# Patient Record
Sex: Female | Born: 1985 | Hispanic: Yes | Marital: Single | Smoking: Never smoker
Health system: Southern US, Community
[De-identification: ages and names within clinical notes are randomized; demographics above are authoritative.]

## PROBLEM LIST (undated history)

## (undated) DIAGNOSIS — O4440 Low lying placenta NOS or without hemorrhage, unspecified trimester: Secondary | ICD-10-CM

## (undated) HISTORY — DX: Low lying placenta nos or without hemorrhage, unspecified trimester: O44.40

---

## 2010-09-20 ENCOUNTER — Ambulatory Visit: Payer: Self-pay | Admitting: Advanced Practice Midwife

## 2010-09-23 ENCOUNTER — Ambulatory Visit: Payer: Self-pay | Admitting: Obstetrics and Gynecology

## 2010-09-25 LAB — PATHOLOGY REPORT

## 2011-01-10 ENCOUNTER — Emergency Department: Payer: Self-pay | Admitting: Emergency Medicine

## 2011-07-18 ENCOUNTER — Inpatient Hospital Stay: Payer: Self-pay | Admitting: Obstetrics and Gynecology

## 2011-08-17 IMAGING — US US OB < 14 WEEKS
1 series · 17 of 28 positions shown · non-contrast
Comparison: None

REASON FOR EXAM: lower abd pain
COMMENTS:

PROCEDURE:     US  - US OB LESS THAN 14 WEEKS  - January 10, 2011 [DATE]
RESULT:     Indication: Lower abdominal pain LMP 10/23/2010
TECHNIQUE: Multiple transabdominal gray-scale images of the pelvis
performed.

[Series 1: us ob < 14 weeks · 17 of 36 slices shown]
[im 1/36]
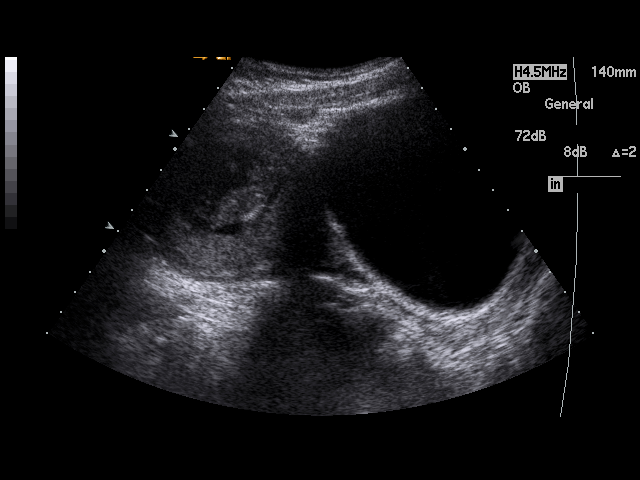
[im 3/36]
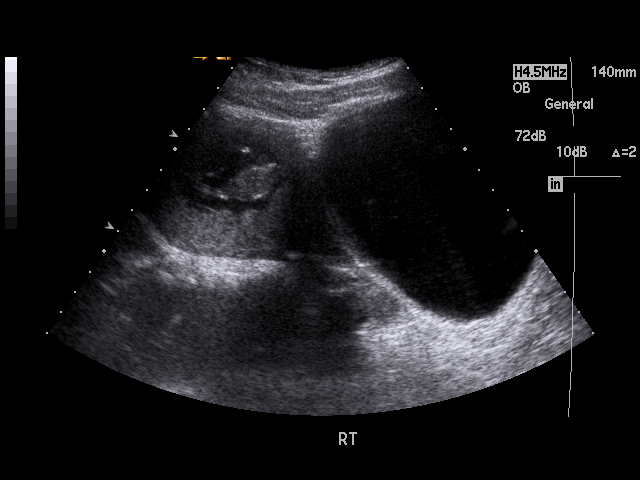
[im 6/36]
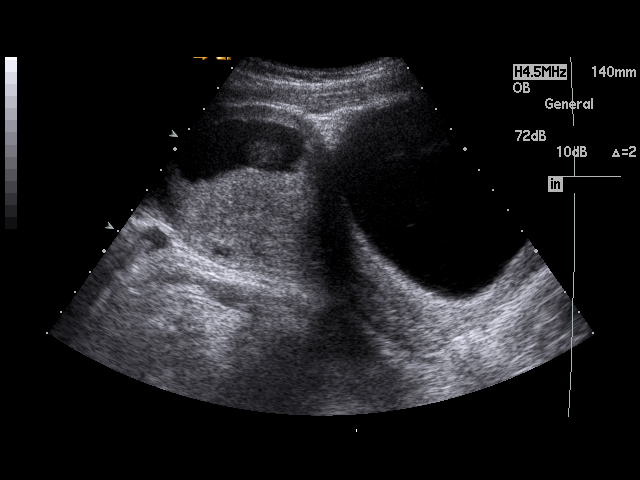
[im 7/36]
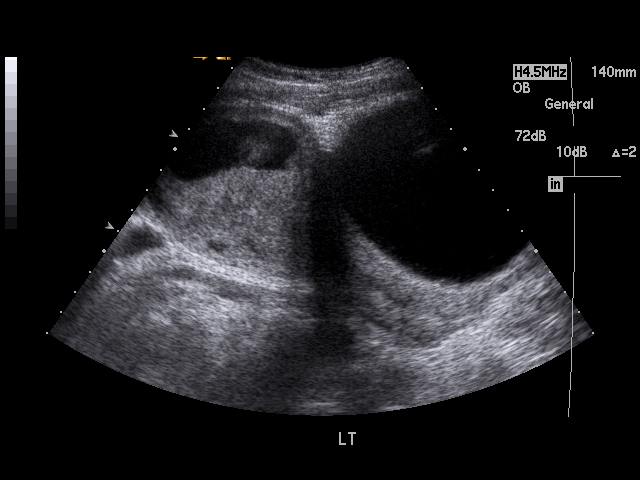
[im 10/36]
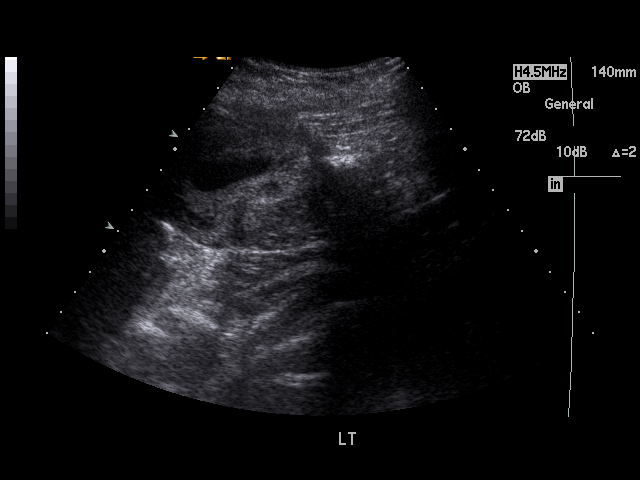
[im 12/36]
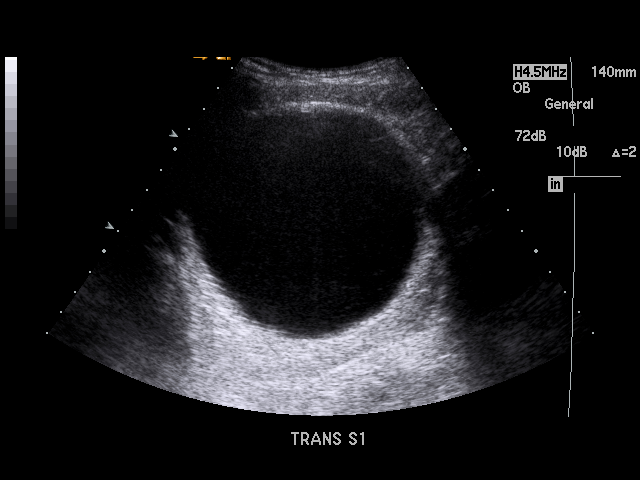
[im 13/36]
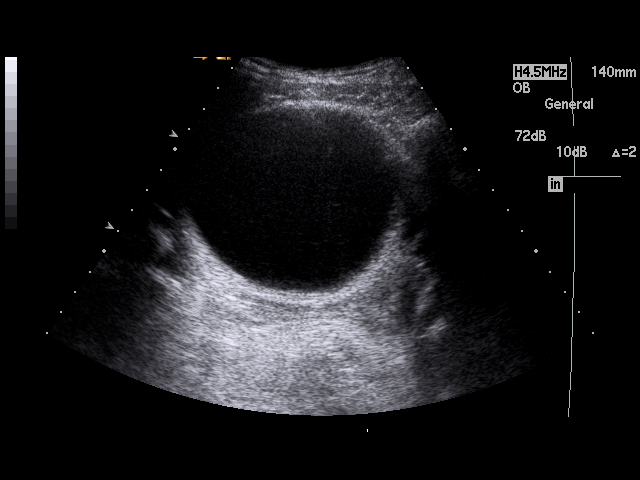
[im 16/36]
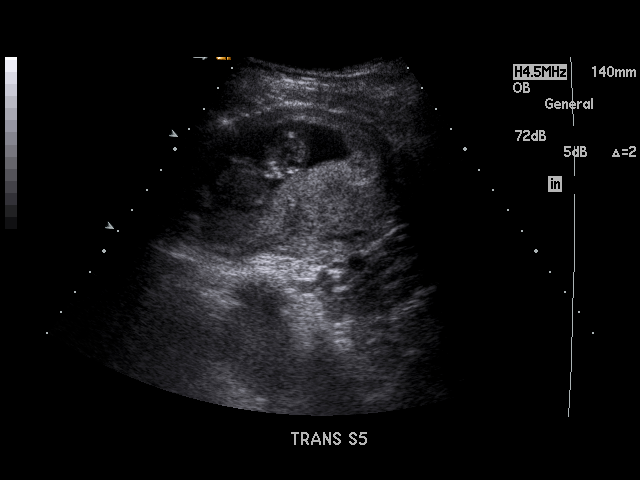
[im 19/36]
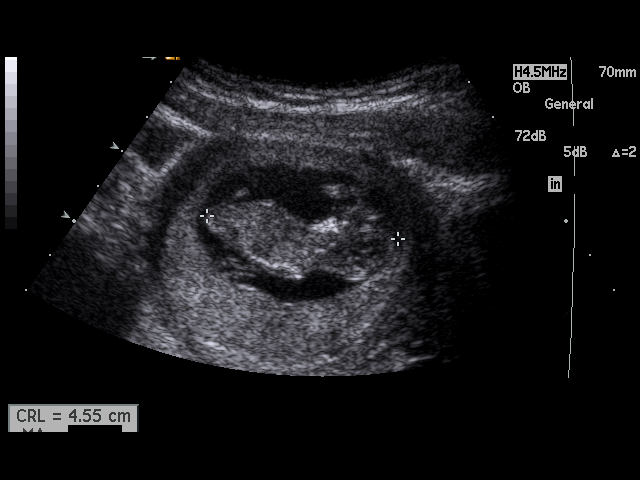
[im 20/36]
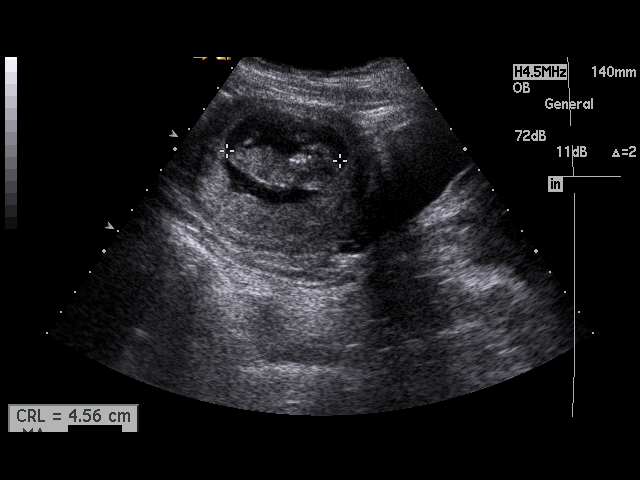
[im 23/36]
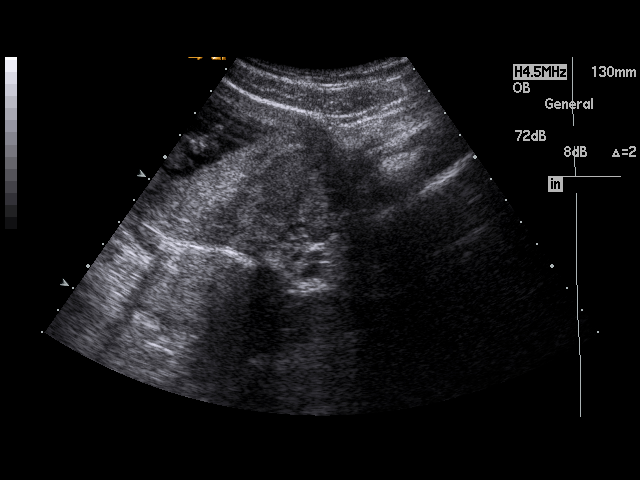
[im 24/36]
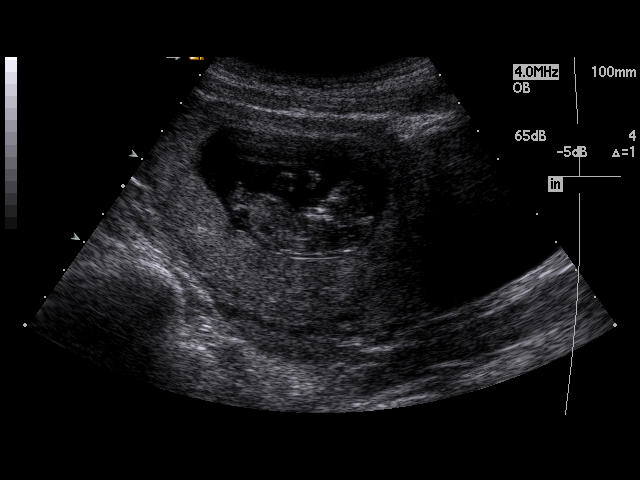
[im 26/36]
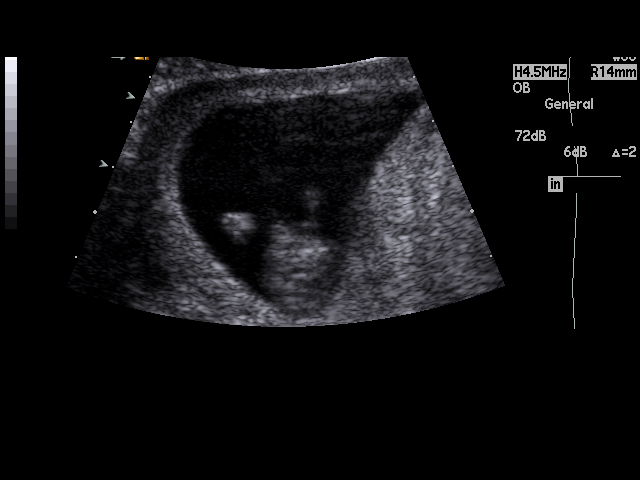
[im 29/36]
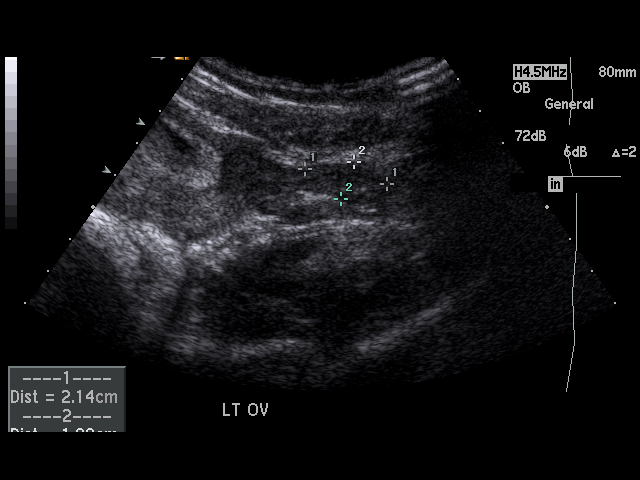
[im 30/36]
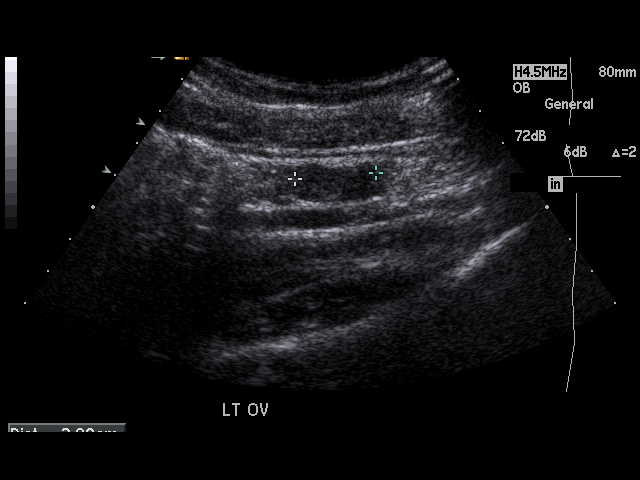
[im 33/36]
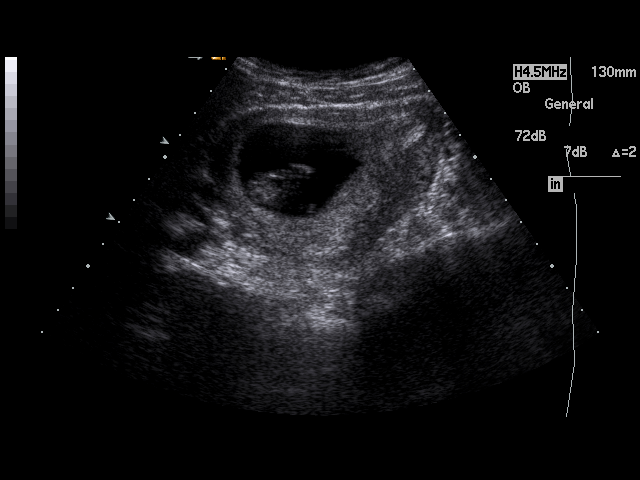
[im 36/36]
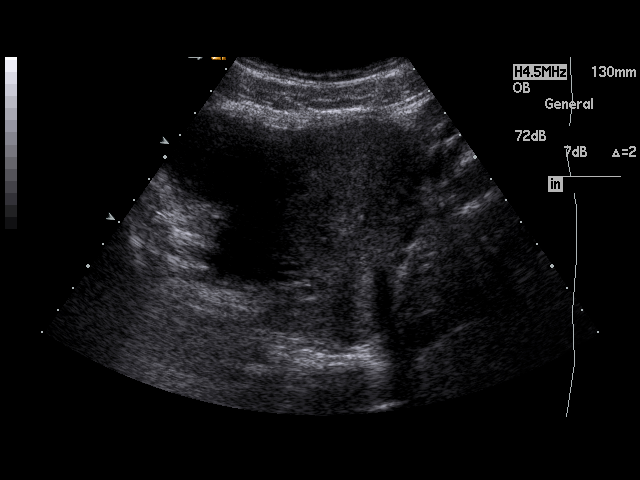

[17 of 28 positions shown; findings below may reference images not displayed]

FINDINGS: There is a single live intrauterine pregnancy visualized with a crown-rump
length of 4.55cm dating the pregnancy at 11 weeks 2 days. There is a normal
yolk sac. There is a normal fetal heart rate of 153 beats per minute. Due to
gestational age, detail images of fetal anatomy were not evaluated.

There is no adnexal mass.  The left ovary is normal in size and echogenicity
measuring 2.1 x 1 x 2.1 cm. The right ovary is normal in size and
echogenicity measuring 1.8 x 2 x 1.6 cm.

There is no pelvic free fluid.
IMPRESSION: Due to gestational age, detail images of fetal anatomy were not evaluated.

Single live intrauterine pregnancy dating 11 weeks 2 days with an estimated
due date of 07/30/2011.

## 2014-12-01 NOTE — L&D Delivery Note (Signed)
Vanette Noguchi PROCEDURE DATE: 06/29/2015  PREOPERATIVE DIAGNOSES: Intrauterine pregnancy at [redacted]w[redacted]d weeks gestation; previous uterine incision LTCS for Breech  POSTOPERATIVE DIAGNOSES: The same and Viable female 7#5  PROCEDURE: Repeat Low Transverse Cesarean Section; Bilateral Partial Salpingectomy  SURGEON:  Martin A. Beatris Si, MD, FACOG  ASSISTANT:  none  ANESTHESIOLOGIST: Dr. Henrene Hawking  INDICATIONS: Yolanda Bruce is a 29 y.o. G3P2011 at [redacted]w[redacted]d here for cesarean section secondary to the indications listed under preoperative diagnoses; please see preoperative note for further details.  The risks of cesarean section were discussed with the patient including but were not limited to: bleeding which may require transfusion or reoperation; infection which may require antibiotics; injury to bowel, bladder, ureters or other surrounding organs; injury to the fetus; need for additional procedures including hysterectomy in the event of a life-threatening hemorrhage; placental abnormalities with subsequent pregnancies, incisional problems, thromboembolic phenomenon and other postoperative/anesthesia complications.   The patient concurred with the proposed plan, giving informed written consent for the procedure.    FINDINGS:  Viable female infant in cephalic presentation.  Apgars 9 and 9.  Clear amniotic fluid.  Intact placenta, three vessel cord.  Normal uterus, fallopian tubes and ovaries bilaterally. Filmy fundal adhesions, lysed.  ANESTHESIA: Spinal INTRAVENOUS FLUIDS:  ml ESTIMATED BLOOD LOSS: URINE OUTPUT: ANTIBIOTIC PROPHYLAXIS: Ancef 2 grams SPECIMENS: Fallopian Tube Segments Cord Gas: N/A COMPLICATIONS: None immediate  DESCRIPTION OF PROCEDURE: Patient was brought to the operating room where she was placed in the supine position. Spinal anesthetic was introduced without difficulty. She was placed in the supine position with a right lateral hip roll in place.Foley catheter was  inserted and was draining clear yellow urine. The abdomen was prepped with ChloraPrep solution and draped in a sterile manner. After timeout and checking for adequate level of anesthesia the procedure was started. Pfannenstiel incision was made into the abdomen. Previous scar was excised with scalpel.The fascia was incised transversely and extended bilaterally with Mayo scissors. The rectus muscle was dissected off the fascia through sharp and blunt dissection. The midline raphae was identified and separated and the peritoneum was entered. Bladder flap was created over lower uterine segment with sharp dissection. A low transverse incision was made in the uterus and this was extended cephalad and caudad in standard fashion. Amniotic fluid was clear. The infant was delivered through vertex presentation and was noted to be active at birth. The umbilical cord was doubly clamped and cut and the infant was handed off to the resuscitating team. Cord blood sampling was not obtained. Placenta was expressed from the uterus. Uterus was externalized onto the anterior abdominal wall and was cleared of all debris with laps. Adhesions were lysed. The incision was closed in one layer using #1 chromic suture in a running locking manner. Bilateral partial salpingectomy was then performed in routine fashion. Fallopian tube was grasped with a Babcock clamp. Mid segment of the fallopian tube was tied off with two 0 plain sutures. The first tie was a free tie. A second stick tie was placed as well. The intervening tube segment was resected. Similar procedure was carried out on the contralateral tube. Good hemostasis was noted. The uterus was then placed back into the abdominal pelvic cavity. Gutters were cleared of all debris with laps. The incision was then closed in layers with 0 Maxon being used on the fascia in a simple running manner. Subcutaneous tissues were re approximated with 2-0 Vicryl suture. The skin was closed with a  subcuticular stitch of 4-0 Vicryl.  Steri-Strips were placed. A Lidoderm patch was placed for analgesia. The patient was then mobilized and taken to the recovery room in satisfactory condition.  Estimated blood loss:750 mL IV fluids:  mL Urine output: . Antibiotic Prophylaxis: Ancef 2 grams  All instruments and needles and sponge counts were verified as correct. The patient did receive Ancef 2 gm antibiotic prophylaxis.   Martin A. Beatris Si, MD, FACOG [facility]

## 2015-01-04 LAB — OB RESULTS CONSOLE HIV ANTIBODY (ROUTINE TESTING): HIV: NONREACTIVE

## 2015-01-05 LAB — OB RESULTS CONSOLE HGB/HCT, BLOOD
HEMATOCRIT: 37 %
Hemoglobin: 12.6 g/dL

## 2015-01-05 LAB — OB RESULTS CONSOLE PLATELET COUNT: Platelets: 186 10*3/uL

## 2015-01-05 LAB — OB RESULTS CONSOLE ANTIBODY SCREEN: Antibody Screen: NEGATIVE

## 2015-01-05 LAB — OB RESULTS CONSOLE ABO/RH: RH Type: POSITIVE

## 2015-01-16 ENCOUNTER — Ambulatory Visit: Payer: Self-pay | Admitting: Physician Assistant

## 2015-04-03 ENCOUNTER — Other Ambulatory Visit: Payer: Self-pay | Admitting: Advanced Practice Midwife

## 2015-04-03 DIAGNOSIS — O4412 Placenta previa with hemorrhage, second trimester: Secondary | ICD-10-CM

## 2015-04-04 ENCOUNTER — Ambulatory Visit
Admission: RE | Admit: 2015-04-04 | Discharge: 2015-04-04 | Disposition: A | Discharge status: Home / Self Care | Payer: Medicaid Other | Source: Ambulatory Visit | Attending: Advanced Practice Midwife | Admitting: Advanced Practice Midwife

## 2015-04-04 DIAGNOSIS — Z3A27 27 weeks gestation of pregnancy: Secondary | ICD-10-CM | POA: Diagnosis not present

## 2015-04-04 DIAGNOSIS — Z09 Encounter for follow-up examination after completed treatment for conditions other than malignant neoplasm: Secondary | ICD-10-CM | POA: Insufficient documentation

## 2015-04-04 DIAGNOSIS — O4412 Placenta previa with hemorrhage, second trimester: Secondary | ICD-10-CM

## 2015-04-09 ENCOUNTER — Other Ambulatory Visit: Payer: Self-pay | Admitting: Family Medicine

## 2015-05-08 ENCOUNTER — Encounter: Payer: Self-pay | Admitting: Obstetrics and Gynecology

## 2015-05-08 ENCOUNTER — Ambulatory Visit (INDEPENDENT_AMBULATORY_CARE_PROVIDER_SITE_OTHER): Payer: Medicaid Other | Admitting: Obstetrics and Gynecology

## 2015-05-08 VITALS — BP 102/63 | HR 93 | Ht 62.0 in | Wt 146.3 lb

## 2015-05-08 DIAGNOSIS — Z3009 Encounter for other general counseling and advice on contraception: Secondary | ICD-10-CM | POA: Diagnosis not present

## 2015-05-08 DIAGNOSIS — Z98891 History of uterine scar from previous surgery: Secondary | ICD-10-CM

## 2015-05-08 DIAGNOSIS — Z9889 Other specified postprocedural states: Secondary | ICD-10-CM

## 2015-05-08 NOTE — Progress Notes (Signed)
Patient ID: Sharyne PeachDaisy Bruce, female   DOB: 1986/09/15, 29 y.o.   MRN: [id number]  The patient has been counseled regarding delivery management planning. She is status post cesarean section delivery previously because of breech presentation. She has not had a prior vaginal delivery. The patient is interested in possible permanent sterilization procedure through postpartum BTL. She is convinced that she would not like to have further children; however, her husband is thinking otherwise. The patient does understand that it is important for both husband and wife to be on the same terms when considering permanent sterilization.  Permanent sterilization has been reviewed. She does understand that it has a failure rate of 1 out of 300. She does understand that it is not reversible. The procedure would be performed postpartum day one if she has a vaginal delivery. Should she require cesarean section delivery, the procedure would be done at the time of her C-section. Problem List Items Addressed This Visit    None    Visit Diagnoses    Previous cesarean section    -  Primary    Sterilization consult          Trial of labor after cesarean section was discussed in detail. Pros and cons of VBAC were reviewed. Benefits and risks of [facility] versus at a tertiary care system were reviewed. Multiple questions were addressed.  The patient will discuss these issues with her husband and return for follow-up at 36-[redacted] weeks gestation in order to decide on definitive delivery management planning and sterilization procedure. She has signed the Medicaid BTL papers today.  Total duration of this conference was 45 minutes. It was completed with the assistance of a Spanish interpreter.  A total of 45 minutes were spent face-to-face with the patient during the encounter with greater than 50% dealing with counseling and coordination of care.   Daphine DeutscherMartin A Ayaz Sondgeroth 05/08/2015

## 2015-05-08 NOTE — Patient Instructions (Signed)
Plan: 1.  Patient is to discuss trial of labor after cesarean section and permanent sterilization.  Pros and cons with her husband. 2.  Patient will return in 4 weeks for further management planning.

## 2015-06-05 LAB — OB RESULTS CONSOLE GBS: GBS: NEGATIVE

## 2015-06-06 ENCOUNTER — Encounter: Payer: Self-pay | Admitting: Obstetrics and Gynecology

## 2015-06-06 ENCOUNTER — Ambulatory Visit (INDEPENDENT_AMBULATORY_CARE_PROVIDER_SITE_OTHER): Payer: Medicaid Other | Admitting: Obstetrics and Gynecology

## 2015-06-06 VITALS — BP 99/63 | HR 96 | Ht 62.0 in | Wt 152.2 lb

## 2015-06-06 DIAGNOSIS — Z9889 Other specified postprocedural states: Secondary | ICD-10-CM | POA: Diagnosis not present

## 2015-06-06 DIAGNOSIS — Z98891 History of uterine scar from previous surgery: Secondary | ICD-10-CM

## 2015-06-06 DIAGNOSIS — Z3493 Encounter for supervision of normal pregnancy, unspecified, third trimester: Secondary | ICD-10-CM

## 2015-06-06 NOTE — Progress Notes (Signed)
Patient ID: Yolanda Bruce, female   DOB: 1986/06/19, 29 y.o.   MRN: [id number]  Pt presents for delivery plan f/u. [facility] pt- 36 1/7. Per pt had gbs/aptima at [facility] yesterday. Would like a vbac.  VBAC Counseling completed. Plan: 1. Allow for spontanoeus labor. 2. Will not induce labor or augment aggressively with Pitocin. 3. Will proceed with Cesarean section for Category 2 or 3 tracing. 4. Will proceed with Repeat C/S and BTL if patient requests it. 5. Return at [redacted] weeks gestation if no onset of labor.  A total of 15 minutes were spent face-to-face with the patient during this encounter and over half of that time dealt with counseling and coordination of care.  Herold HarmsMartin A Tywana Robotham, MD

## 2015-06-12 DIAGNOSIS — Z98891 History of uterine scar from previous surgery: Secondary | ICD-10-CM | POA: Insufficient documentation

## 2015-06-28 ENCOUNTER — Encounter
Admission: RE | Admit: 2015-06-28 | Discharge: 2015-06-28 | Disposition: A | Discharge status: Home / Self Care | Payer: Medicaid Other | Source: Ambulatory Visit | Attending: Obstetrics and Gynecology | Admitting: Obstetrics and Gynecology

## 2015-06-28 ENCOUNTER — Encounter: Payer: Medicaid Other | Admitting: Obstetrics and Gynecology

## 2015-06-28 ENCOUNTER — Encounter: Payer: Self-pay | Admitting: Obstetrics and Gynecology

## 2015-06-28 LAB — CBC WITH DIFFERENTIAL/PLATELET
BASOS PCT: 1 %
Basophils Absolute: 0.1 10*3/uL (ref 0–0.1)
EOS PCT: 1 %
Eosinophils Absolute: 0.1 10*3/uL (ref 0–0.7)
HCT: 35.8 % (ref 35.0–47.0)
HEMOGLOBIN: 12.1 g/dL (ref 12.0–16.0)
LYMPHS PCT: 14 %
Lymphs Abs: 1.3 10*3/uL (ref 1.0–3.6)
MCH: 31.3 pg (ref 26.0–34.0)
MCHC: 33.8 g/dL (ref 32.0–36.0)
MCV: 92.5 fL (ref 80.0–100.0)
MONO ABS: 0.6 10*3/uL (ref 0.2–0.9)
MONOS PCT: 7 %
NEUTROS ABS: 7.3 10*3/uL — AB (ref 1.4–6.5)
Neutrophils Relative %: 77 %
PLATELETS: 151 10*3/uL (ref 150–440)
RBC: 3.87 MIL/uL (ref 3.80–5.20)
RDW: 12.6 % (ref 11.5–14.5)
WBC: 9.5 10*3/uL (ref 3.6–11.0)

## 2015-06-28 LAB — RAPID HIV SCREEN (HIV 1/2 AB+AG)
HIV 1/2 ANTIBODIES: NONREACTIVE
HIV-1 P24 Antigen - HIV24: NONREACTIVE

## 2015-06-28 LAB — TYPE AND SCREEN
ABO/RH(D): AB POS
Antibody Screen: NEGATIVE

## 2015-06-28 NOTE — H&P (Signed)
Obstetric Preoperative History and Physical  Yolanda Bruce is a 29 y.o. G2P1001 with IUP at [redacted]w[redacted]d presenting for presenting for scheduled repeat cesarean section.  No acute concerns. BPS desired;Medicaid tubal papers have been signed.  Prenatal Course Source of Care: [facility]   Pregnancy complications or risks: Patient Active Problem List   Diagnosis Date Noted  . H/O: C-section 06/12/2015   She plans to breastfeed She desires bilateral tubal ligation for postpartum contraception.   Prenatal labs and studies: ABO, Rh: --/--/AB POS (07/28 1457) Antibody: NEG (07/28 1457) Rubella:   RPR:    HBsAg:    HIV:    GBS:  1 hr Glucola  normal Genetic screening normal Anatomy US normal  Prenatal Transfer Tool  Maternal Diabetes: No Genetic Screening: Normal Maternal Ultrasounds/Referrals: Normal Fetal Ultrasounds or other Referrals:  None Maternal Substance Abuse:  No Significant Maternal Medications:  None Significant Maternal Lab Results: None  Past Medical History  Diagnosis Date  . Low-lying placenta 04/04/2015-    see u/s    Past Surgical History  Procedure Laterality Date  . Cesarean section  2012    OB History  Gravida Para Term Preterm AB SAB TAB Ectopic Multiple Living  # Outcome Date GA Lbr Len/2nd Weight Sex Delivery Anes PTL Lv  2 Current           1 Term 07/18/11 [redacted]w[redacted]d  6 lb 14 oz (3.118 kg) F CS-LTranv  N Y      History   Social History  . Marital Status: Single    Spouse Name: N/A  . Number of Children: N/A  . Years of Education: N/A   Social History Main Topics  . Smoking status: Never Smoker   . Smokeless tobacco: Not on file  . Alcohol Use: No  . Drug Use: No  . Sexual Activity: Not Currently   Other Topics Concern  . None   Social History Narrative    Family History  Problem Relation Age of Onset  . Breast cancer Neg Hx   . Ovarian cancer Neg Hx   . Colon cancer Neg Hx   . Diabetes Neg Hx   . Heart disease Neg Hx       (Not in a hospital admission)  No Known Allergies  Review of Systems: Negative except for what is mentioned in HPI.  Physical Exam: BP 100/65 mmHg  Pulse 91  Ht  (1.575 m)  Wt 153 lb 3.2 oz (69.491 kg)  BMI 28.01 kg/m2  LMP 09/24/2014 (Exact Date) FHR by Doppler: 154 bpm GENERAL: Well-developed, well-nourished female in no acute distress.  LUNGS: Clear to auscultation bilaterally.  HEART: Regular rate and rhythm. ABDOMEN: Soft, nontender, nondistended, gravid, well-healed Pfannenstiel incision. FH 35 cm. PELVIC: Deferred EXTREMITIES: Nontender, no edema, 2+ distal pulses.   Pertinent Labs/Studies:   No results found for this or any previous visit (from the past 72 hour(s)).  Assessment and Plan :Yolanda Bruce is a 29 y.o. G2P1001 at [redacted]w[redacted]d being admitted  for scheduled cesarean section delivery with BPS. The patient is understanding of the planned procedure and is aware of and accepting of all surgical risks, including but not limited to: bleeding which may require transfusion or reoperation; infection which may require antibiotics; injury to bowel, bladder, ureters or other surrounding organs which may require repair; injury to the fetus; need for additional procedures including hysterectomy in the event of life-threatening complications; placental abnormalities  wth subsequent pregnancies; incisional problems; blood clot disorders which may require blood thinners;, and other postoperative/anesthesia complications. BPS is permanent and has 1/300 failure rate. The patient is in agreement with the proposed plan, and gives informed written consent for the procedure. All questions have been answered.  Martin A. Beatris Si, MD, FACOG [facility]

## 2015-06-28 NOTE — Progress Notes (Signed)
Patient ID: Yolanda Bruce, female   DOB: 03-27-1986, 29 y.o.   MRN: [id number]   This encounter was created in error - please disregard.

## 2015-06-29 ENCOUNTER — Inpatient Hospital Stay
Admission: EM | Admit: 2015-06-29 | Discharge: 2015-07-02 | DRG: 765 | Disposition: A | Discharge status: Home / Self Care | Payer: Medicaid Other | Attending: Obstetrics and Gynecology | Admitting: Obstetrics and Gynecology

## 2015-06-29 ENCOUNTER — Inpatient Hospital Stay: Payer: Medicaid Other | Admitting: *Deleted

## 2015-06-29 ENCOUNTER — Encounter
Admission: EM | Disposition: A | Discharge status: Home / Self Care | Payer: Self-pay | Source: Home / Self Care | Attending: Obstetrics and Gynecology

## 2015-06-29 ENCOUNTER — Encounter: Payer: Self-pay | Admitting: *Deleted

## 2015-06-29 ENCOUNTER — Inpatient Hospital Stay
Admission: RE | Admit: 2015-06-29 | Payer: Medicaid Other | Source: Ambulatory Visit | Admitting: Obstetrics and Gynecology

## 2015-06-29 DIAGNOSIS — Z3483 Encounter for supervision of other normal pregnancy, third trimester: Secondary | ICD-10-CM

## 2015-06-29 DIAGNOSIS — Z8249 Family history of ischemic heart disease and other diseases of the circulatory system: Secondary | ICD-10-CM

## 2015-06-29 DIAGNOSIS — Z808 Family history of malignant neoplasm of other organs or systems: Secondary | ICD-10-CM

## 2015-06-29 DIAGNOSIS — Z803 Family history of malignant neoplasm of breast: Secondary | ICD-10-CM

## 2015-06-29 DIAGNOSIS — Z302 Encounter for sterilization: Secondary | ICD-10-CM | POA: Diagnosis not present

## 2015-06-29 DIAGNOSIS — Z8041 Family history of malignant neoplasm of ovary: Secondary | ICD-10-CM | POA: Diagnosis not present

## 2015-06-29 DIAGNOSIS — Z800 Family history of malignant neoplasm of digestive organs: Secondary | ICD-10-CM | POA: Diagnosis not present

## 2015-06-29 DIAGNOSIS — Z3A39 39 weeks gestation of pregnancy: Secondary | ICD-10-CM | POA: Diagnosis present

## 2015-06-29 DIAGNOSIS — Z825 Family history of asthma and other chronic lower respiratory diseases: Secondary | ICD-10-CM

## 2015-06-29 DIAGNOSIS — O9081 Anemia of the puerperium: Secondary | ICD-10-CM | POA: Diagnosis not present

## 2015-06-29 DIAGNOSIS — Z833 Family history of diabetes mellitus: Secondary | ICD-10-CM

## 2015-06-29 DIAGNOSIS — O3421 Maternal care for scar from previous cesarean delivery: Secondary | ICD-10-CM | POA: Diagnosis present

## 2015-06-29 DIAGNOSIS — D62 Acute posthemorrhagic anemia: Secondary | ICD-10-CM | POA: Diagnosis not present

## 2015-06-29 LAB — ABO/RH: ABO/RH(D): AB POS

## 2015-06-29 SURGERY — Surgical Case
Anesthesia: Spinal | Wound class: Clean Contaminated

## 2015-06-29 MED ORDER — KETOROLAC TROMETHAMINE 30 MG/ML IJ SOLN
30.0000 mg | Freq: Four times a day (QID) | INTRAMUSCULAR | Status: DC | PRN
  Administered 2015-06-29 (×2): 30 mg via INTRAVENOUS
  Filled 2015-06-29 (×2): qty 1

## 2015-06-29 MED ORDER — CEFAZOLIN SODIUM-DEXTROSE 2-3 GM-% IV SOLR
2.0000 g | INTRAVENOUS | Status: AC
  Administered 2015-06-29: 2 g via INTRAVENOUS
  Filled 2015-06-29: qty 50

## 2015-06-29 MED ORDER — FERROUS SULFATE 325 (65 FE) MG PO TABS
325.0000 mg | ORAL_TABLET | Freq: Two times a day (BID) | ORAL | Status: DC
  Administered 2015-06-30 – 2015-07-02 (×5): 325 mg via ORAL
  Filled 2015-06-29 (×5): qty 1

## 2015-06-29 MED ORDER — LACTATED RINGERS IV SOLN
Freq: Once | INTRAVENOUS | Status: AC
  Administered 2015-06-29: 06:00:00 via INTRAVENOUS

## 2015-06-29 MED ORDER — PRENATAL MULTIVITAMIN CH
1.0000 | ORAL_TABLET | Freq: Every day | ORAL | Status: DC
  Administered 2015-06-30: 1 via ORAL
  Filled 2015-06-29: qty 1

## 2015-06-29 MED ORDER — CITRIC ACID-SODIUM CITRATE 334-500 MG/5ML PO SOLN
30.0000 mL | Freq: Once | ORAL | Status: AC
  Administered 2015-06-29: 30 mL via ORAL

## 2015-06-29 MED ORDER — NALOXONE HCL 1 MG/ML IJ SOLN: 1.0000 ug/kg/h | INTRAMUSCULAR | Status: DC | PRN

## 2015-06-29 MED ORDER — MIDAZOLAM HCL 2 MG/2ML IJ SOLN
INTRAMUSCULAR | Status: DC | PRN
  Administered 2015-06-29: 2 mg via INTRAVENOUS

## 2015-06-29 MED ORDER — NALBUPHINE HCL 10 MG/ML IJ SOLN: 5.0000 mg | INTRAMUSCULAR | Status: DC | PRN

## 2015-06-29 MED ORDER — IBUPROFEN 800 MG PO TABS
800.0000 mg | ORAL_TABLET | Freq: Three times a day (TID) | ORAL | Status: DC
  Administered 2015-06-29 – 2015-07-02 (×8): 800 mg via ORAL
  Filled 2015-06-29 (×7): qty 1

## 2015-06-29 MED ORDER — LANOLIN HYDROUS EX OINT: 1.0000 "application " | TOPICAL_OINTMENT | CUTANEOUS | Status: DC | PRN

## 2015-06-29 MED ORDER — IBUPROFEN 800 MG PO TABS
800.0000 mg | ORAL_TABLET | Freq: Three times a day (TID) | ORAL | Status: DC
  Filled 2015-06-29: qty 1

## 2015-06-29 MED ORDER — DIPHENHYDRAMINE HCL 25 MG PO CAPS: 25.0000 mg | ORAL_CAPSULE | Freq: Four times a day (QID) | ORAL | Status: DC | PRN

## 2015-06-29 MED ORDER — DIPHENHYDRAMINE HCL 50 MG/ML IJ SOLN: 12.5000 mg | INTRAMUSCULAR | Status: DC | PRN

## 2015-06-29 MED ORDER — LACTATED RINGERS IV SOLN
INTRAVENOUS | Status: DC
  Administered 2015-06-29: 07:00:00 via INTRAVENOUS

## 2015-06-29 MED ORDER — LACTATED RINGERS IV SOLN: INTRAVENOUS | Status: DC

## 2015-06-29 MED ORDER — DIPHENHYDRAMINE HCL 25 MG PO CAPS: 25.0000 mg | ORAL_CAPSULE | ORAL | Status: DC | PRN

## 2015-06-29 MED ORDER — FENTANYL CITRATE (PF) 100 MCG/2ML IJ SOLN
25.0000 ug | INTRAMUSCULAR | Status: DC | PRN
  Administered 2015-06-29: 50 ug via INTRAVENOUS
  Administered 2015-06-29: 25 ug via INTRAVENOUS
  Administered 2015-06-29: 50 ug via INTRAVENOUS
  Administered 2015-06-29: 25 ug via INTRAVENOUS
  Filled 2015-06-29: qty 2

## 2015-06-29 MED ORDER — PHENYLEPHRINE HCL 10 MG/ML IJ SOLN
INTRAMUSCULAR | Status: DC | PRN
  Administered 2015-06-29: 100 ug via INTRAVENOUS

## 2015-06-29 MED ORDER — MEASLES, MUMPS & RUBELLA VAC ~~LOC~~ INJ: 0.5000 mL | INJECTION | Freq: Once | SUBCUTANEOUS | Status: DC

## 2015-06-29 MED ORDER — LACTATED RINGERS IV SOLN
INTRAVENOUS | Status: DC
  Administered 2015-06-29: 08:00:00 via INTRAVENOUS

## 2015-06-29 MED ORDER — TETANUS-DIPHTH-ACELL PERTUSSIS 5-2.5-18.5 LF-MCG/0.5 IM SUSP: 0.5000 mL | Freq: Once | INTRAMUSCULAR | Status: DC

## 2015-06-29 MED ORDER — MORPHINE SULFATE 2 MG/ML IJ SOLN: 2.0000 mg | INTRAMUSCULAR | Status: DC | PRN

## 2015-06-29 MED ORDER — NALOXONE HCL 0.4 MG/ML IJ SOLN: 0.4000 mg | INTRAMUSCULAR | Status: DC | PRN

## 2015-06-29 MED ORDER — LIDOCAINE 5 % EX PTCH: 1.0000 | MEDICATED_PATCH | CUTANEOUS | Status: DC

## 2015-06-29 MED ORDER — OXYTOCIN 40 UNITS IN LACTATED RINGERS INFUSION - SIMPLE MED
INTRAVENOUS | Status: DC | PRN
  Administered 2015-06-29: 500 mL via INTRAVENOUS

## 2015-06-29 MED ORDER — FENTANYL CITRATE (PF) 100 MCG/2ML IJ SOLN
INTRAMUSCULAR | Status: AC
  Administered 2015-06-29: 100 ug
  Filled 2015-06-29: qty 2

## 2015-06-29 MED ORDER — OXYCODONE-ACETAMINOPHEN 5-325 MG PO TABS
1.0000 | ORAL_TABLET | ORAL | Status: DC | PRN
  Administered 2015-06-30 – 2015-07-02 (×6): 1 via ORAL
  Filled 2015-06-29 (×6): qty 1

## 2015-06-29 MED ORDER — KETOROLAC TROMETHAMINE 30 MG/ML IJ SOLN: 30.0000 mg | Freq: Four times a day (QID) | INTRAMUSCULAR | Status: DC | PRN

## 2015-06-29 MED ORDER — ONDANSETRON HCL 4 MG/2ML IJ SOLN: 4.0000 mg | Freq: Three times a day (TID) | INTRAMUSCULAR | Status: DC | PRN

## 2015-06-29 MED ORDER — LIDOCAINE 5 % EX PTCH
1.0000 | MEDICATED_PATCH | CUTANEOUS | Status: DC
  Administered 2015-06-29 – 2015-07-02 (×4): 1 via TRANSDERMAL
  Filled 2015-06-29 (×4): qty 1

## 2015-06-29 MED ORDER — SODIUM CHLORIDE 0.9 % IJ SOLN
3.0000 mL | INTRAMUSCULAR | Status: DC | PRN
Start: 2015-06-29 — End: 2015-07-01

## 2015-06-29 MED ORDER — CITRIC ACID-SODIUM CITRATE 334-500 MG/5ML PO SOLN
ORAL | Status: AC
Start: 2015-06-29 — End: 2015-06-29
  Filled 2015-06-29: qty 15

## 2015-06-29 MED ORDER — ONDANSETRON HCL 4 MG/2ML IJ SOLN
INTRAMUSCULAR | Status: DC | PRN
  Administered 2015-06-29: 4 mg via INTRAVENOUS

## 2015-06-29 MED ORDER — OXYTOCIN 40 UNITS IN LACTATED RINGERS INFUSION - SIMPLE MED
62.5000 mL/h | INTRAVENOUS | Status: DC
  Administered 2015-06-29: 62.5 mL/h via INTRAVENOUS
  Filled 2015-06-29: qty 1000

## 2015-06-29 MED ORDER — MORPHINE SULFATE (PF) 0.5 MG/ML IJ SOLN
INTRAMUSCULAR | Status: DC | PRN
  Administered 2015-06-29: 200 ug via EPIDURAL

## 2015-06-29 MED ORDER — OXYCODONE-ACETAMINOPHEN 5-325 MG PO TABS
2.0000 | ORAL_TABLET | ORAL | Status: DC | PRN
  Administered 2015-06-30: 2 via ORAL
  Filled 2015-06-29: qty 2

## 2015-06-29 MED ORDER — ACETAMINOPHEN 325 MG PO TABS: 650.0000 mg | ORAL_TABLET | ORAL | Status: DC | PRN

## 2015-06-29 MED ORDER — EPHEDRINE SULFATE 50 MG/ML IJ SOLN
INTRAMUSCULAR | Status: DC | PRN
  Administered 2015-06-29: 5 mg via INTRAVENOUS

## 2015-06-29 SURGICAL SUPPLY — 22 items
CANISTER SUCT 3000ML (MISCELLANEOUS) ×3 IMPLANT
CHLORAPREP W/TINT 26ML (MISCELLANEOUS) ×3 IMPLANT
CLOSURE WOUND 1/2 X4 (GAUZE/BANDAGES/DRESSINGS) ×1
DRSG TELFA 3X8 NADH (GAUZE/BANDAGES/DRESSINGS) ×3 IMPLANT
GAUZE SPONGE 4X4 12PLY STRL (GAUZE/BANDAGES/DRESSINGS) ×3 IMPLANT
GLOVE BIO SURGEON STRL SZ8 (GLOVE) ×3 IMPLANT
GOWN STRL REUS W/ TWL LRG LVL3 (GOWN DISPOSABLE) ×2 IMPLANT
GOWN STRL REUS W/ TWL XL LVL3 (GOWN DISPOSABLE) ×1 IMPLANT
GOWN STRL REUS W/TWL LRG LVL3 (GOWN DISPOSABLE) ×4
GOWN STRL REUS W/TWL XL LVL3 (GOWN DISPOSABLE) ×2
NS IRRIG 1000ML POUR BTL (IV SOLUTION) ×3 IMPLANT
PACK C SECTION AR (MISCELLANEOUS) ×3 IMPLANT
PAD GROUND ADULT SPLIT (MISCELLANEOUS) ×3 IMPLANT
PAD OB MATERNITY 4.3X12.25 (PERSONAL CARE ITEMS) ×3 IMPLANT
PAD PREP 24X41 OB/GYN DISP (PERSONAL CARE ITEMS) ×3 IMPLANT
STRAP SAFETY BODY (MISCELLANEOUS) ×3 IMPLANT
STRIP CLOSURE SKIN 1/2X4 (GAUZE/BANDAGES/DRESSINGS) ×2 IMPLANT
SUT CHROMIC 1-0 (SUTURE) ×9 IMPLANT
SUT MAXON ABS #0 GS21 30IN (SUTURE) ×6 IMPLANT
SUT PLAIN GUT 0 (SUTURE) ×6 IMPLANT
SUT VIC AB 2-0 CT1 27 (SUTURE) ×6
SUT VIC AB 2-0 CT1 TAPERPNT 27 (SUTURE) ×3 IMPLANT

## 2015-06-29 NOTE — Op Note (Signed)
Note by Herold Harms, MD at 06/29/2015 8:58 AM    Author: Herold Harms, MD Service: Obstetrics/Gynecology Author Type: Physician   Filed: 06/29/2015 9:02 AM Note Time: 06/29/2015 8:58 AM Status: Signed   Editor: Herold Harms, MD (Physician)     Expand All Collapse All   Yolanda Bruce PROCEDURE DATE: 06/29/2015  PREOPERATIVE DIAGNOSES: Intrauterine pregnancy at [redacted]w[redacted]d weeks gestation; previous uterine incision LTCS for Breech  POSTOPERATIVE DIAGNOSES: The same and Viable female 7#5  PROCEDURE: Repeat Low Transverse Cesarean Section; Bilateral Partial Salpingectomy  SURGEON: Daphine Deutscher A. Beatris Si, MD, FACOG  ASSISTANT: none  ANESTHESIOLOGIST: Dr. Henrene Hawking  INDICATIONS: Yolanda Bruce is a 29 y.o. G3P2011 at [redacted]w[redacted]d here for cesarean section secondary to the indications listed under preoperative diagnoses; please see preoperative note for further details. The risks of cesarean section were discussed with the patient including but were not limited to: bleeding which may require transfusion or reoperation; infection which may require antibiotics; injury to bowel, bladder, ureters or other surrounding organs; injury to the fetus; need for additional procedures including hysterectomy in the event of a life-threatening hemorrhage; placental abnormalities with subsequent pregnancies, incisional problems, thromboembolic phenomenon and other postoperative/anesthesia complications. The patient concurred with the proposed plan, giving informed written consent for the procedure.   FINDINGS: Viable female infant in cephalic presentation. Apgars 9 and 9. Clear amniotic fluid. Intact placenta, three vessel cord. Normal uterus, fallopian tubes and ovaries bilaterally. Filmy fundal adhesions, lysed.  ANESTHESIA: Spinal INTRAVENOUS FLUIDS: ml ESTIMATED BLOOD LOSS: URINE OUTPUT: ANTIBIOTIC PROPHYLAXIS: Ancef 2 grams SPECIMENS: Fallopian Tube Segments Cord Gas:  N/A COMPLICATIONS: None immediate  DESCRIPTION OF PROCEDURE: Patient was brought to the operating room where she was placed in the supine position. Spinal anesthetic was introduced without difficulty. She was placed in the supine position with a right lateral hip roll in place.Foley catheter was inserted and was draining clear yellow urine. The abdomen was prepped with ChloraPrep solution and draped in a sterile manner. After timeout and checking for adequate level of anesthesia the procedure was started. Pfannenstiel incision was made into the abdomen. Previous scar was excised with scalpel.The fascia was incised transversely and extended bilaterally with Mayo scissors. The rectus muscle was dissected off the fascia through sharp and blunt dissection. The midline raphae was identified and separated and the peritoneum was entered. Bladder flap was created over lower uterine segment with sharp dissection. A low transverse incision was made in the uterus and this was extended cephalad and caudad in standard fashion. Amniotic fluid was clear. The infant was delivered through vertex presentation and was noted to be active at birth. The umbilical cord was doubly clamped and cut and the infant was handed off to the resuscitating team. Cord blood sampling was not obtained. Placenta was expressed from the uterus. Uterus was externalized onto the anterior abdominal wall and was cleared of all debris with laps. Adhesions were lysed. The incision was closed in one layer using #1 chromic suture in a running locking manner. Bilateral partial salpingectomy was then performed in routine fashion. Fallopian tube was grasped with a Babcock clamp. Mid segment of the fallopian tube was tied off with two 0 plain sutures. The first tie was a free tie. A second stick tie was placed as well. The intervening tube segment was resected. Similar procedure was carried out on the contralateral tube. Good hemostasis was noted. The uterus was  then placed back into the abdominal pelvic cavity. Gutters were cleared of all debris  with laps. The incision was then closed in layers with 0 Maxon being used on the fascia in a simple running manner. Subcutaneous tissues were re approximated with 2-0 Vicryl suture. The skin was closed with a subcuticular stitch of 4-0 Vicryl. Steri-Strips were placed. A Lidoderm patch was placed for analgesia. The patient was then mobilized and taken to the recovery room in satisfactory condition.  Estimated blood loss:750 mL IV fluids: mL Urine output: . Antibiotic Prophylaxis: Ancef 2 grams  All instruments and needles and sponge counts were verified as correct. The patient did receive Ancef 2 gm antibiotic prophylaxis.   Martin A. Beatris Si, MD, FACOG [facility]

## 2015-06-29 NOTE — Anesthesia Preprocedure Evaluation (Addendum)
Anesthesia Evaluation  Patient identified by MRN, date of birth, ID band Patient awake    Reviewed: Allergy & Precautions, NPO status , Patient's Chart, lab work & pertinent test results  History of Anesthesia Complications Negative for: history of anesthetic complications  Airway Mallampati: II  TM Distance: >3 FB Neck ROM: Full    Dental  (+) Teeth Intact   Pulmonary          Cardiovascular negative cardio ROS      Neuro/Psych    GI/Hepatic negative GI ROS, Neg liver ROS,   Endo/Other  negative endocrine ROS  Renal/GU negative Renal ROS     Musculoskeletal   Abdominal   Peds  Hematology negative hematology ROS (+)   Anesthesia Other Findings   Reproductive/Obstetrics (+) Pregnancy                             Anesthesia Physical Anesthesia Plan  ASA: II  Anesthesia Plan: Spinal   Post-op Pain Management:    Induction:   Airway Management Planned: Nasal Cannula  Additional Equipment:   Intra-op Plan:   Post-operative Plan:   Informed Consent: I have reviewed the patients History and Physical, chart, labs and discussed the procedure including the risks, benefits and alternatives for the proposed anesthesia with the patient or authorized representative who has indicated his/her understanding and acceptance.     Plan Discussed with:   Anesthesia Plan Comments:         Anesthesia Quick Evaluation

## 2015-06-29 NOTE — H&P (View-Only) (Signed)
Obstetric Preoperative History and Physical  Yolanda Bruce is a 29 y.o. G2P1001 with IUP at [redacted]w[redacted]d presenting for presenting for scheduled repeat cesarean section.  No acute concerns. BPS desired;Medicaid tubal papers have been signed.  Prenatal Course Source of Care: [facility]   Pregnancy complications or risks: Patient Active Problem List   Diagnosis Date Noted  . H/O: C-section 06/12/2015   She plans to breastfeed She desires bilateral tubal ligation for postpartum contraception.   Prenatal labs and studies: ABO, Rh: --/--/AB POS (07/28 1457) Antibody: NEG (07/28 1457) Rubella:   RPR:    HBsAg:    HIV:    GBS:  1 hr Glucola  normal Genetic screening normal Anatomy US normal  Prenatal Transfer Tool  Maternal Diabetes: No Genetic Screening: Normal Maternal Ultrasounds/Referrals: Normal Fetal Ultrasounds or other Referrals:  None Maternal Substance Abuse:  No Significant Maternal Medications:  None Significant Maternal Lab Results: None  Past Medical History  Diagnosis Date  . Low-lying placenta 04/04/2015-    see u/s    Past Surgical History  Procedure Laterality Date  . Cesarean section  2012    OB History  Gravida Para Term Preterm AB SAB TAB Ectopic Multiple Living  # Outcome Date GA Lbr Len/2nd Weight Sex Delivery Anes PTL Lv  2 Current           1 Term 07/18/11 [redacted]w[redacted]d  6 lb 14 oz (3.118 kg) F CS-LTranv  N Y      History   Social History  . Marital Status: Single    Spouse Name: N/A  . Number of Children: N/A  . Years of Education: N/A   Social History Main Topics  . Smoking status: Never Smoker   . Smokeless tobacco: Not on file  . Alcohol Use: No  . Drug Use: No  . Sexual Activity: Not Currently   Other Topics Concern  . None   Social History Narrative    Family History  Problem Relation Age of Onset  . Breast cancer Neg Hx   . Ovarian cancer Neg Hx   . Colon cancer Neg Hx   . Diabetes Neg Hx   . Heart disease Neg Hx       (Not in a hospital admission)  No Known Allergies  Review of Systems: Negative except for what is mentioned in HPI.  Physical Exam: BP 100/65 mmHg  Pulse 91  Ht  (1.575 m)  Wt 153 lb 3.2 oz (69.491 kg)  BMI 28.01 kg/m2  LMP 09/24/2014 (Exact Date) FHR by Doppler: 154 bpm GENERAL: Well-developed, well-nourished female in no acute distress.  LUNGS: Clear to auscultation bilaterally.  HEART: Regular rate and rhythm. ABDOMEN: Soft, nontender, nondistended, gravid, well-healed Pfannenstiel incision. FH 35 cm. PELVIC: Deferred EXTREMITIES: Nontender, no edema, 2+ distal pulses.   Pertinent Labs/Studies:   No results found for this or any previous visit (from the past 72 hour(s)).  Assessment and Plan :Yolanda Bruce is a 29 y.o. G2P1001 at [redacted]w[redacted]d being admitted  for scheduled cesarean section delivery with BPS. The patient is understanding of the planned procedure and is aware of and accepting of all surgical risks, including but not limited to: bleeding which may require transfusion or reoperation; infection which may require antibiotics; injury to bowel, bladder, ureters or other surrounding organs which may require repair; injury to the fetus; need for additional procedures including hysterectomy in the event of life-threatening complications; placental abnormalities  wth subsequent pregnancies; incisional problems; blood clot disorders which may require blood thinners;, and other postoperative/anesthesia complications. BPS is permanent and has 1/300 failure rate. The patient is in agreement with the proposed plan, and gives informed written consent for the procedure. All questions have been answered.  Shamon Cothran A. Beatris Si, MD, FACOG [facility]

## 2015-06-29 NOTE — Interval H&P Note (Signed)
History and Physical Interval Note:  06/29/2015 7:12 AM  Yolanda Bruce  has presented today for surgery, with the diagnosis of PRIOR CSECTION  The various methods of treatment have been discussed with the patient and family. After consideration of risks, benefits and other options for treatment, the patient has consented to  Procedure(s): CESAREAN SECTION WITH BILATERAL TUBAL LIGATION (N/A) as a surgical intervention .  The patient's history has been reviewed, patient examined, no change in status, stable for surgery.  I have reviewed the patient's chart and labs.  Questions were answered to the patient's satisfaction.     Prentice Docker Defrancesco   Date of Initial H&P: 06/28/2015   History reviewed, patient examined, no change in status, stable for surgery.

## 2015-06-29 NOTE — OR Nursing (Signed)
Lidocaine patch applied by Dr. Ardine Bjork at  08:48 am

## 2015-06-29 NOTE — Transfer of Care (Signed)
Immediate Anesthesia Transfer of Care Note  Patient: Saraphina Lauderbaugh  Procedure(s) Performed: Procedure(s) with comments: CESAREAN SECTION WITH BILATERAL TUBAL LIGATION (N/A) - Time of Birth 8:10am Female Wt: 7 lb 3 oz. Apgar: 1 min: 9  5 min: 9    Patient Location: PACU  Anesthesia Type:Spinal  Level of Consciousness: awake, alert , oriented and patient cooperative  Airway & Oxygen Therapy: Patient Spontanous Breathing  Post-op Assessment: Report given to RN and Post -op Vital signs reviewed and stable  Post vital signs: Reviewed and stable  Last Vitals:  Filed Vitals:   06/29/15 0554  BP: 113/64  Pulse: 81  Temp: 37.1 C  Resp: 18    Complications: No apparent anesthesia complications

## 2015-06-29 NOTE — Anesthesia Procedure Notes (Signed)
Spinal Patient location during procedure: OR Start time: 06/29/2015 7:45 AM End time: 06/29/2015 7:56 AM Reason for block: at surgeon's request Staffing Performed by: anesthesiologist  Preanesthetic Checklist Completed: patient identified, site marked, surgical consent, pre-op evaluation, timeout performed, IV checked, risks and benefits discussed, monitors and equipment checked and at surgeon's request Spinal Block Patient position: sitting Prep: Betadine Patient monitoring: heart rate, cardiac monitor, continuous pulse ox and blood pressure Approach: midline Location: L3-4 Injection technique: single-shot Needle Needle type: [place]  Needle gauge: 27 G Needle length: 9 cm Needle insertion depth: 5 cm

## 2015-06-30 LAB — CBC
HCT: 28.6 % — ABNORMAL LOW (ref 35.0–47.0)
Hemoglobin: 9.8 g/dL — ABNORMAL LOW (ref 12.0–16.0)
MCH: 31.7 pg (ref 26.0–34.0)
MCHC: 34.3 g/dL (ref 32.0–36.0)
MCV: 92.4 fL (ref 80.0–100.0)
PLATELETS: 123 10*3/uL — AB (ref 150–440)
RBC: 3.1 MIL/uL — ABNORMAL LOW (ref 3.80–5.20)
RDW: 12.4 % (ref 11.5–14.5)
WBC: 10.9 10*3/uL (ref 3.6–11.0)

## 2015-06-30 LAB — VARICELLA ZOSTER ANTIBODY, IGG: Varicella IgG: 2661 index (ref >165)

## 2015-06-30 LAB — RUBELLA SCREEN: Rubella: 8.55 index (ref >0.99)

## 2015-06-30 LAB — RPR: RPR Ser Ql: NONREACTIVE

## 2015-06-30 NOTE — Anesthesia Post-op Follow-up Note (Signed)
  Anesthesia Pain Follow-up Note  Patient: Yolanda Bruce  Day #: 1  Date of Follow-up: 06/30/2015 Time: 8:39 AM  Last Vitals:  Filed Vitals:   06/30/15 0504  BP: 100/66  Pulse: 73  Temp: 37.1 C  Resp: 18    Level of Consciousness: alert  Pain: none   Side Effects:None  Catheter Site Exam:clean, dry, no drainage  Plan: D/C from anesthesia care  Basilio Cairo

## 2015-06-30 NOTE — Anesthesia Postprocedure Evaluation (Signed)
  Anesthesia Post-op Note  Patient: Yolanda Bruce  Procedure(s) Performed: Procedure(s) with comments: CESAREAN SECTION WITH BILATERAL TUBAL LIGATION (N/A) - Time of Birth 8:10am Female Wt: 7 lb 3 oz. Apgar: 1 min: 9  5 min: 9   Anesthesia type:Spinal  Patient location: PACU  Post pain: Pain level controlled  Post assessment: Post-op Vital signs reviewed, Patient's Cardiovascular Status Stable, Respiratory Function Stable, Patent Airway and No signs of Nausea or vomiting  Post vital signs: Reviewed and stable  Last Vitals:  Filed Vitals:   06/30/15 0504  BP: 100/66  Pulse: 73  Temp: 37.1 C  Resp: 18    Level of consciousness: awake, alert  and patient cooperative  Complications: No apparent anesthesia complications

## 2015-06-30 NOTE — Progress Notes (Signed)
Subjective: Postpartum Day 1: Cesarean Delivery with BPS Patient reports incisional pain and tolerating PO.    Objective: Vital signs in last 24 hours: Temp:  [97.3 F (36.3 C)-99.2 F (37.3 C)] 98.8 F (37.1 C) (07/30 0504) Pulse Rate:  [65-120] 73 (07/30 0504) Resp:  [14-18] 18 (07/30 0504) BP: (99-151)/(51-77) 100/66 mmHg (07/30 0504) SpO2:  [99 %-100 %] 100 % (07/30 0504) Weight:  [153 lb (69.4 kg)] 153 lb (69.4 kg) (07/29 0900)  Physical Exam:  General: alert and cooperative Lochia: appropriate Uterine Fundus: firm Incision: healing well DVT Evaluation: No evidence of DVT seen on physical exam.   Recent Labs  06/28/15 1457 06/30/15 0605  HGB 12.1 9.8*  HCT 35.8 28.6*    Assessment/Plan: Status post Cesarean section. Doing well postoperatively.  Mild Post Op Anemia  D/C Foley PNV , FESO4 BID, PO analgesics Ambulate Advance diet.  Daphine Deutscher A Viktorya Arguijo 06/30/2015, 6:54 AM

## 2015-06-30 NOTE — Plan of Care (Signed)
Problem: Phase II Progression Outcomes Goal: Progress activity as tolerated unless otherwise ordered Outcome: Completed/Met Date Met:  06/30/15 Pt out of bed to bathroom and ambulating. Goal: Incision intact & without signs/symptoms of infection Outcome: Completed/Met Date Met:  06/30/15 Dressing removed, steri strips intact, no signs/symptoms of infection. Goal: Other Phase II Outcomes/Goals Outcome: Progressing Continue to work with lactation and nursing staff with breastfeeding positioning and latch.

## 2015-07-01 ENCOUNTER — Encounter: Payer: Self-pay | Admitting: Obstetrics and Gynecology

## 2015-07-01 MED ORDER — FERROUS SULFATE 325 (65 FE) MG PO TABS: 325.0000 mg | ORAL_TABLET | Freq: Two times a day (BID) | ORAL | Status: AC

## 2015-07-01 MED ORDER — LIDOCAINE 5 % EX PTCH: 1.0000 | MEDICATED_PATCH | CUTANEOUS | Status: DC

## 2015-07-01 MED ORDER — OXYCODONE-ACETAMINOPHEN 5-325 MG PO TABS: 2.0000 | ORAL_TABLET | ORAL | Status: AC | PRN

## 2015-07-01 MED ORDER — IBUPROFEN 800 MG PO TABS: 800.0000 mg | ORAL_TABLET | Freq: Three times a day (TID) | ORAL | Status: AC

## 2015-07-01 NOTE — Anesthesia Postprocedure Evaluation (Incomplete)
  Anesthesia Post-op Note  Patient: Yolanda Bruce  Procedure(s) Performed: Procedure(s) with comments: CESAREAN SECTION WITH BILATERAL TUBAL LIGATION (N/A) - Time of Birth 8:10am Female Wt: 7 lb 3 oz. Apgar: 1 min: 9  5 min: 9   Anesthesia type:Spinal  Patient location: PACU  Post pain: Pain level controlled  Post assessment: Post-op Vital signs reviewed, Patient's Cardiovascular Status Stable, Respiratory Function Stable, Patent Airway and No signs of Nausea or vomiting  Post vital signs: Reviewed and stable  Last Vitals:  Filed Vitals:   07/01/15 1505  BP:   Pulse:   Temp: 37.1 C  Resp:     Level of consciousness: awake, alert  and patient cooperative  Complications: No apparent anesthesia complications

## 2015-07-01 NOTE — Progress Notes (Signed)
Subjective: Postpartum Day 2: Cesarean Delivery with BPS Patient reports tolerating PO, + flatus and no problems voiding.    Objective: Vital signs in last 24 hours: Temp:  [97.5 F (36.4 C)-99.4 F (37.4 C)] 98.4 F (36.9 C) (07/31 0751) Pulse Rate:  [62-74] 72 (07/31 0751) Resp:  [16-18] 18 (07/31 0751) BP: (96-110)/(50-62) 105/52 mmHg (07/31 0751) SpO2:  [97 %-98 %] 97 % (07/31 0751)  Physical Exam:  General: alert and cooperative Lochia: appropriate Uterine Fundus: firm Incision: healing well DVT Evaluation: No evidence of DVT seen on physical exam.   Recent Labs  06/28/15 1457 06/30/15 0605  HGB 12.1 9.8*  HCT 35.8 28.6*    Assessment/Plan: Status post Cesarean section. Doing well postoperatively.  Continue current care.  Yolanda Bruce 07/01/2015, 10:32 AM

## 2015-07-01 NOTE — Addendum Note (Signed)
Addendum  created 07/01/15 1639 by Junious Silk, CRNA   Modules edited: Notes Section   Notes Section:  Pend: [phone]

## 2015-07-01 NOTE — Discharge Summary (Signed)
Obstetric Discharge Summary Reason for Admission: cesarean section with BPS Prenatal Procedures: NST and ultrasound Intrapartum Procedures: cesarean: low cervical, transverse and tubal ligation Postpartum Procedures: none Complications-Operative and Postpartum: Post Op Anemia, Asymptomatic HEMOGLOBIN  Date Value Ref Range Status  06/30/2015 9.8* 12.0 - 16.0 g/dL Final  16/09/9603 54.0 g/dL Final   HCT  Date Value Ref Range Status  06/30/2015 28.6* 35.0 - 47.0 % Final  01/05/2015 37 % Final    Physical Exam:  General: alert and cooperative Lochia: appropriate Uterine Fundus: firm Incision: healing well DVT Evaluation: No evidence of DVT seen on physical exam.  Discharge Diagnoses: Term Pregnancy-delivered and s/p Repeat C/S with BPS  Discharge Information: Date: 07/01/2015 Activity: unrestricted Diet: routine Medications: PNV, Ibuprofen, Colace, Iron and Percocet Condition: stable Instructions: refer to practice specific booklet Discharge to: home   Newborn Data: Live born female  Birth Weight:  7#5 APGAR: 9, 9 Breastfeeding  Home with mother.  Yolanda Bruce 07/01/2015, 3:15 PM

## 2015-07-02 LAB — SURGICAL PATHOLOGY

## 2015-07-02 MED ORDER — LIDOCAINE 5 % EX PTCH: 1.0000 | MEDICATED_PATCH | CUTANEOUS | Status: AC

## 2015-07-02 NOTE — Discharge Instructions (Signed)
Parto por cesrea - Cuidados posteriores  (Cesarean Delivery, Care After) Siga estas instrucciones durante las prximas semanas. Estas indicaciones le proporcionan informacin general acerca de cmo deber cuidarse despus del procedimiento. El mdico tambin podr darle instrucciones ms especficas. El tratamiento se ha planificado de acuerdo a las prcticas mdicas actuales, pero a veces se producen problemas. Comunquese con el mdico si tiene algn problema o tiene dudas cuando vuelva a su casa.  INSTRUCCIONES PARA EL CUIDADO EN EL HOGAR  Tome slo medicamentos de venta libre o recetados, segn las indicaciones del mdico.  No beba alcohol, especialmente si est amamantando o toma analgsicos.  Nomastique tabaco ni fume.  Contine con un adecuado cuidado perineal. El buen cuidado perineal incluye:  Higienizarse de adelante hacia atrs.  Mantener la zona perineal limpia.  Controlar diariamente el corte (incisin) y observar si aumenta el enrojecimiento, si supura, se hincha o se separa la piel.  Limpie la incisin suavemente con jabn y agua todos los das, y luego squela dando golpecitos. Si el mdico la autoriza, deje la incisin al descubierto. Use un apsito (vendaje) si drena lquido o la incisin parece irritada. Si las pequeas tiras [facility] que cruzan la incisin no se caen dentro de los 7 das, retrelas suavemente.  Abrace una almohada al toser o estornudar hasta que la incisin se cure. Esto ayuda a Best boy.  No conduzca vehculos ni opere maquinarias hasta que el mdico la autorice.  Dchese, lvese el cabello y tome baos de inmersin segn las indicaciones de su mdico.  Utilice un sostn que le ajuste bien y que brinde buen soporte a sus Glass blower/designer.  Limite el uso de bombachas de sostn o medias panty.  Beba suficiente lquido para Consulting civil engineer orina clara o de color amarillo plido.  Consuma todos los das alimentos ricos en fibra como cereales y panes  [place], arroz, frijoles y frutas frescas y verduras. Estos alimentos pueden ayudarla a prevenir o Cytogeneticist.  Reanude las actividades como subir escaleras, conducir automviles, levantar objetos pesados, hacer ejercicios o viajar cuando le indique su mdico.  Hable con su mdico acerca de reanudar la actividad sexual. Volver a la actividad sexual depende del riesgo de infeccin, la velocidad de la curacin y la comodidad y su deseo de Financial controller.  Trate de que alguien la ayude con las actividades del hogar y con el recin nacido al menos durante algunos das despus de salir del hospital.  Descanse todo lo que pueda. Trate de descansar o tomar una siesta mientras el beb est durmiendo.  Aumente sus actividades gradualmente.  Cumpla con todos los controles programados para despus del [place]. Es muy importante asistir a todas las visitas de Nurse, adult. En estas visitas, su mdico va a controlarla para asegurarse de que est sanando fsica y emocionalmente. SOLICITE ATENCIN MDICA SI:   Elimina cogulos grandes por la vagina. Guarde algunos cogulos para mostrarle al mdico.  Tiene una secrecin con feo olor que proviene de la vagina.  Tiene dificultad para orinar.  Orina con frecuencia.  Siente dolor al [employer].  Nota un cambio en sus movimientos intestinales.  Aumenta el enrojecimiento, el dolor o la hinchazn en la zona de la incisin.  Observa que supura pus en la incisin.  La incisin se abre.  Sus [employer] duelen, estn duras o enrojecidas.  Sufre un dolor intenso de [place].  Tiene visin borrosa o ve manchas.  Se siente triste o deprimida.  Tiene pensamientos acerca de lastimarse o daar al  recin nacido.  Tiene preguntas acerca de su cuidado, la atencin del recin nacido o acerca de los medicamentos.  Se siente mareada o sufre un desmayo.  Tiene una erupcin.  Siente dolor u observa enrojecimiento o hinchazn en el sitio en que  estaba la va intravenosa (IV).  Tiene nuseas o vmitos.  Usted dej de amamantar al beb y no ha tenido su perodo menstrual dentro de las 12 semanas siguientes.  No amamanta al beb y no tuvo su perodo menstrual en las ltimas 12 semanas.  Tiene fiebre. SOLICITE ATENCIN MDICA DE INMEDIATO SI:   Siente dolor persistente.  Siente dolor en el pecho.  Le falta el aire.  Se desmaya.  Siente dolor en la pierna.  Siente Research scientist (life sciences).  El sangrado vaginal satura dos o ms apsitos en 1 hora. ASEGRESE DE QUE:   Comprende estas instrucciones.  Controlar su enfermedad.  Recibir ayuda de inmediato si no mejora o si empeora. Document Released: 11/17/2005 Document Revised: 04/03/2014 [facility] Patient Information 2015 [place], [place]. This information is not intended to replace advice given to you by your health care provider. Make sure you discuss any questions you have with your health care provider.

## 2015-07-02 NOTE — Plan of Care (Signed)
Problem: Discharge Progression Outcomes Goal: Other Discharge Outcomes/Goals Outcome: Adequate for Discharge Pt feels comfortable with breastfeeding and feels like she is able to supply baby with enough milk. Pt has been over 24 hours without pacifier or formula supplement.

## 2015-07-02 NOTE — Progress Notes (Signed)
Pt discharge instructions reviewed with interpreter in room. Spouse and mother present on teaching and all verbalized understanding. Pt asked questions and verbalized understanding of all mother and baby instructions. Pt given wound care cleaning kit and verbalized understanding of its use.

## 2015-07-02 NOTE — Progress Notes (Signed)
Postpartum Day 3: Cesarean Delivery  Subjective: Patient reports tolerating PO, + flatus and no problems voiding.  Denies complaints.   Objective: Blood pressure 106/62, pulse 70, temperature 98.7 F (37.1 C), temperature source Oral, resp. rate 18, height  (1.575 m), weight 153 lb (69.4 kg), last menstrual period 09/24/2014, SpO2 100 %, unknown if currently breastfeeding.  Physical Exam:  General: alert and no distress Lochia: appropriate Uterine Fundus: firm Incision: healing well, no significant drainage, no dehiscence, no significant erythema DVT Evaluation: No evidence of DVT seen on physical exam. No cords or calf tenderness. No significant calf/ankle edema.  CBC Latest Ref Rng 06/30/2015 06/28/2015  WBC 3.6 - 11.0 K/uL 10.9 9.5  Hemoglobin 12.0 - 16.0 g/dL 8.2(N) 56.2  Hematocrit 35.0 - 47.0 % 28.6(L) 35.8  Platelets 150 - 440 K/uL 123(L) 151     Assessment/Plan: Status post Cesarean section. Doing well postoperatively.  Discharge home with standard precautions and return to clinic in 1 week for post-op check, and in 6 weeks for routine postpartum exam. Anemia - mild.  Will need PO supplementation with iron sulfate.   Hildred Laser 07/02/2015, 1:24 PM

## 2015-07-05 ENCOUNTER — Ambulatory Visit (INDEPENDENT_AMBULATORY_CARE_PROVIDER_SITE_OTHER): Payer: Medicaid Other | Admitting: Obstetrics and Gynecology

## 2015-07-05 ENCOUNTER — Encounter: Payer: Self-pay | Admitting: Obstetrics and Gynecology

## 2015-07-05 VITALS — BP 138/82 | HR 52 | Ht 62.0 in | Wt 143.4 lb

## 2015-07-05 DIAGNOSIS — R350 Frequency of micturition: Secondary | ICD-10-CM | POA: Diagnosis not present

## 2015-07-05 DIAGNOSIS — Z9889 Other specified postprocedural states: Secondary | ICD-10-CM

## 2015-07-05 DIAGNOSIS — Z98891 History of uterine scar from previous surgery: Secondary | ICD-10-CM

## 2015-07-05 LAB — POCT URINALYSIS DIPSTICK
Bilirubin, UA: NEGATIVE
GLUCOSE UA: NEGATIVE
Ketones, UA: NEGATIVE
NITRITE UA: NEGATIVE
Protein, UA: NEGATIVE
SPEC GRAV UA: 1.02
Urobilinogen, UA: NEGATIVE
pH, UA: 6.5

## 2015-07-05 NOTE — Progress Notes (Signed)
Subjective:     Yolanda Bruce is a 29 y.o. female who presents to the clinic 1 weeks status post repeat C-section with BTL for h/o prior C-section. Eating a regular diet without difficulty. Bowel movements are normal. Pain is controlled with current analgesics. Medications being used: ibuprofen (OTC).  The following portions of the patient's history were reviewed and updated as appropriate: allergies, current medications, past family history, past medical history, past social history, past surgical history and problem list.  Review of Systems A comprehensive review of systems was negative except for: Genitourinary: positive for frequency    Objective:    BP 138/82 mmHg  Pulse 52  Ht  (1.575 m)  Wt 143 lb 6 oz (65.034 kg)  BMI 26.22 kg/m2  LMP 09/24/2014 (Exact Date) General:  alert and no distress  Abdomen: soft, bowel sounds active, non-tender  Incision:   healing well, no drainage, no erythema, no hernia, no seroma, no swelling, no dehiscence, incision well approximated.  Steri-strips previously removed.       Labs:  Urinalysis    Component Value Date/Time   BILIRUBINUR negative 07/05/2015 1622   PROTEINUR negtive 07/05/2015 1622   UROBILINOGEN negative 07/05/2015 1622   NITRITE negative 07/05/2015 1622   LEUKOCYTESUR Trace* 07/05/2015 1622      Assessment:    Doing well postoperatively.  Urinary frequency. Operative findings again reviewed. Pathology report discussed.    Plan:   1. Continue any current medications. 2. Wound care discussed.  Patient very concerned regarding healing now that steri-strips have fallen off.  Notes difficulties with wound healing last C-section after staple removal.  Placed new steri-strips over incision.  Given patient reassurance.  3. Activity restrictions: no bending, stooping, or squatting, no lifting more than 15 pounds and pelvic rest x 5 weeks. 4. Anticipated return to work: work Physicist, medical provided and 5 weeks. 5. UA ordered for  urinary frequency, negative for evidence of infection.  Follow up: 5 weeks with [facility] for postpartum visit.    Hildred Laser, MD [facility]

## 2015-07-18 NOTE — Progress Notes (Signed)
This encounter was created in error - please disregard.

## 2015-11-09 IMAGING — US US OB LIMITED
1 series · 14 of 28 positions shown · non-contrast
Comparison: none

CLINICAL DATA: Follow-up of low-lying placenta previa study was
January 16, 2015

EXAM:
OBSTETRIC 14+ WK ULTRASOUND FOLLOW-UP

[Series 1: us ob limited · 14 of 59 slices shown]
[im 3/59]
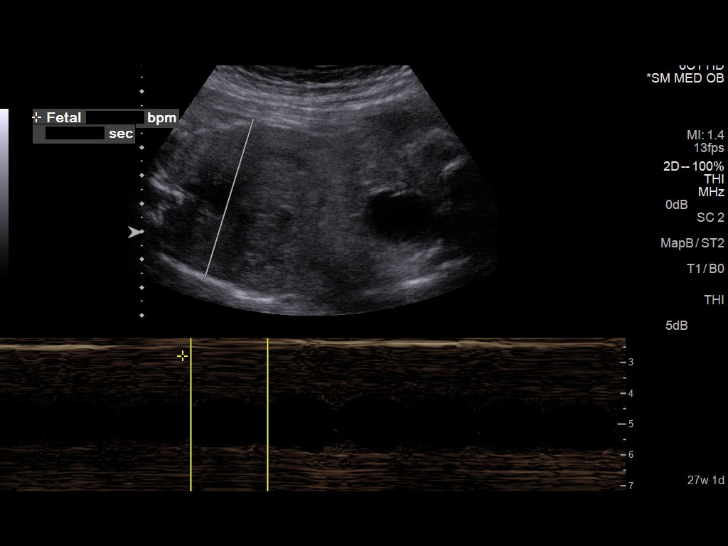
[im 7/59]
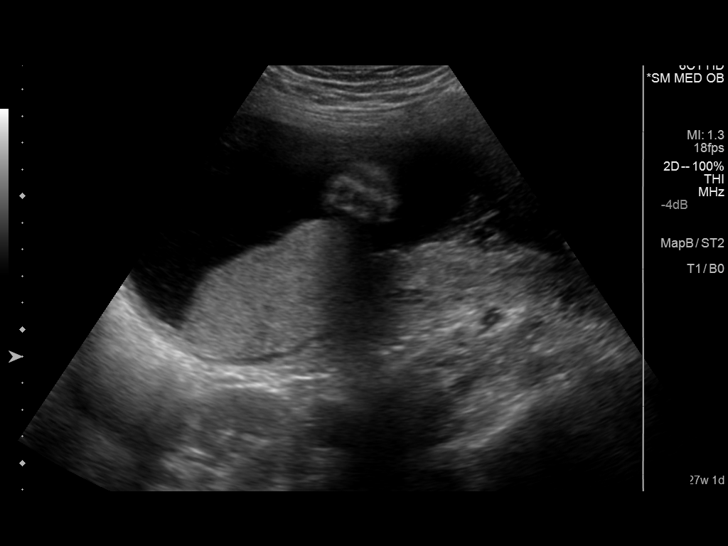
[im 11/59]
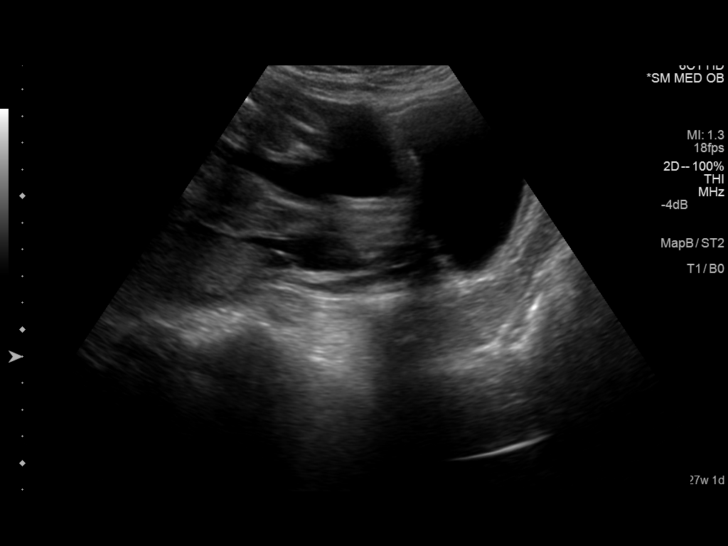
[im 16/59]
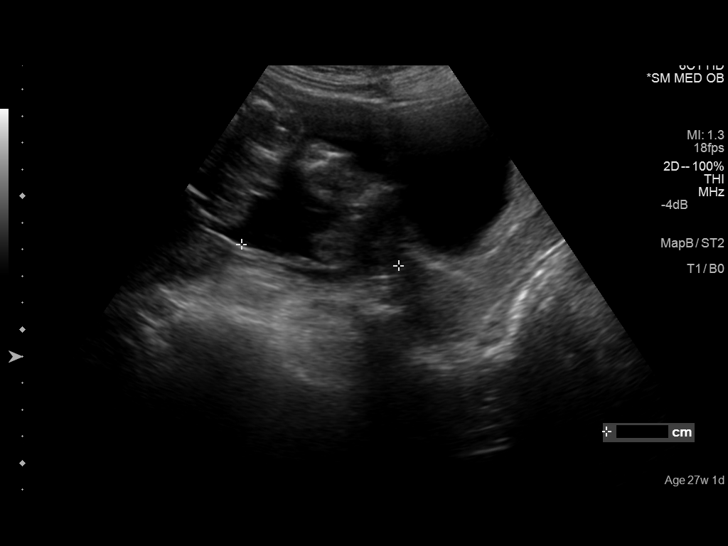
[im 20/59]
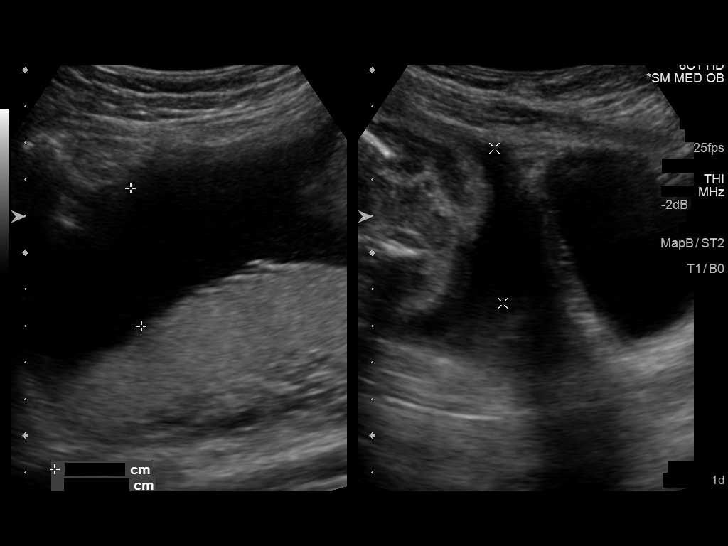
[im 24/59]
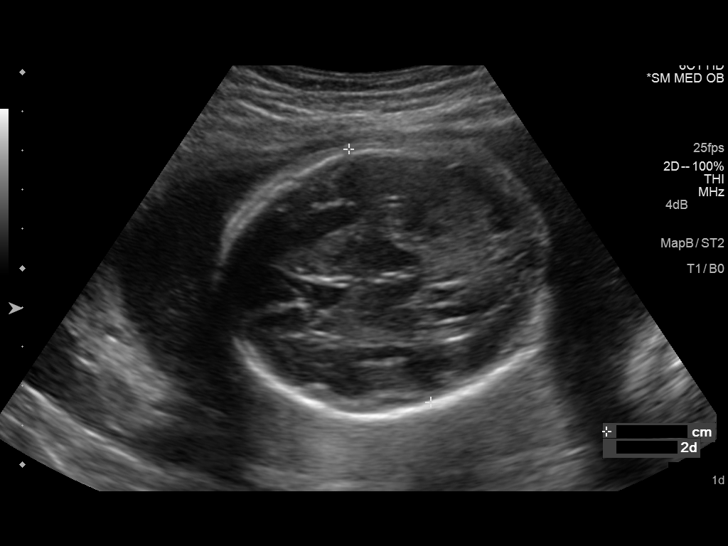
[im 28/59]
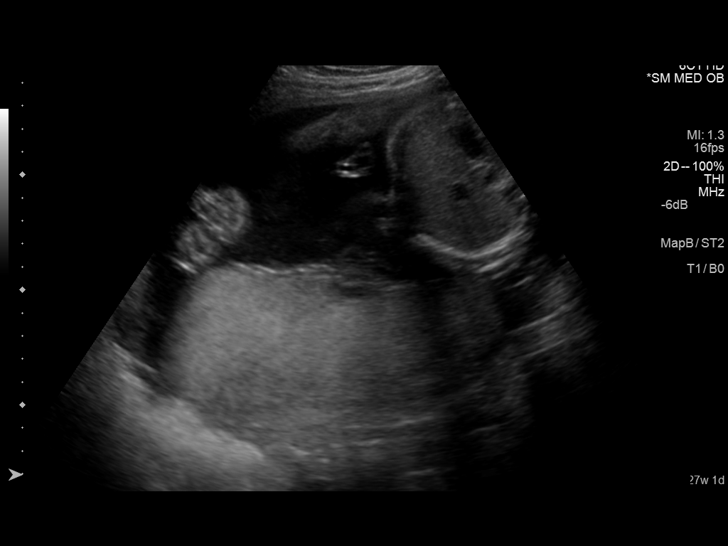
[im 33/59]
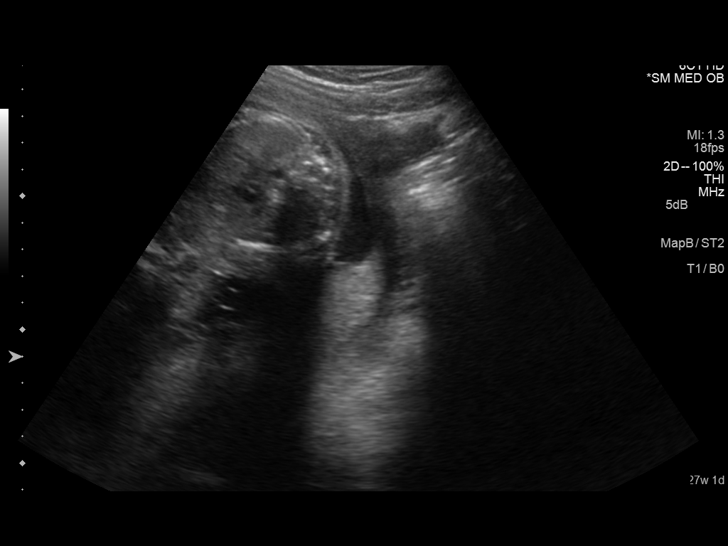
[im 37/59]
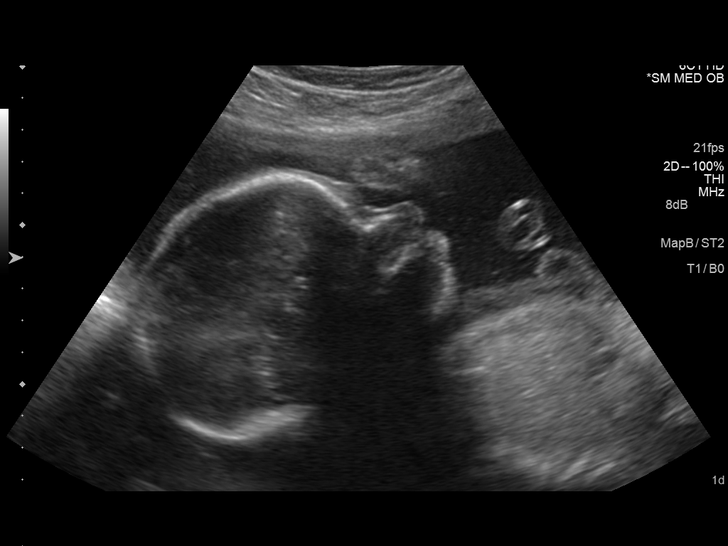
[im 41/59]
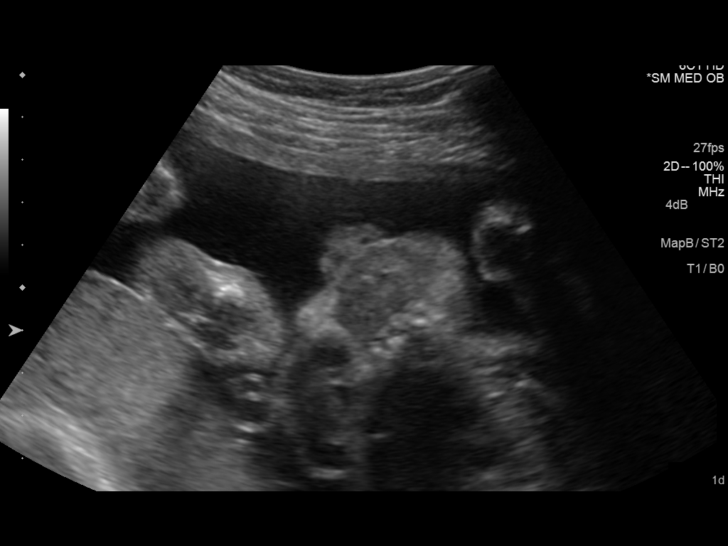
[im 46/59]
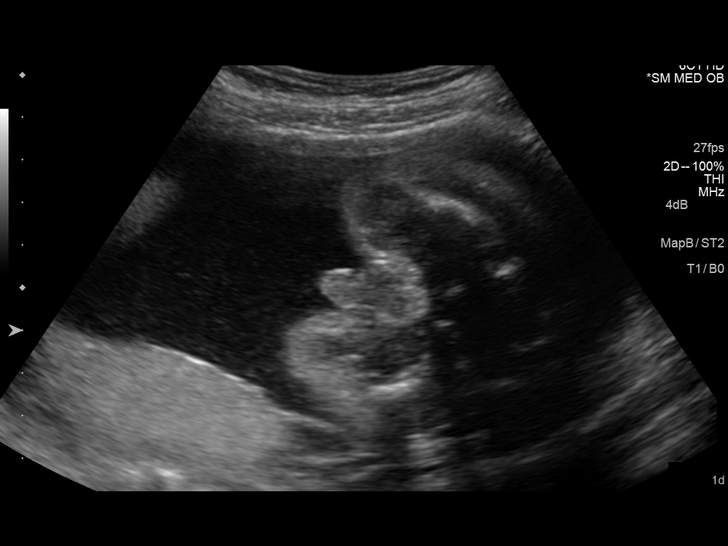
[im 50/59]
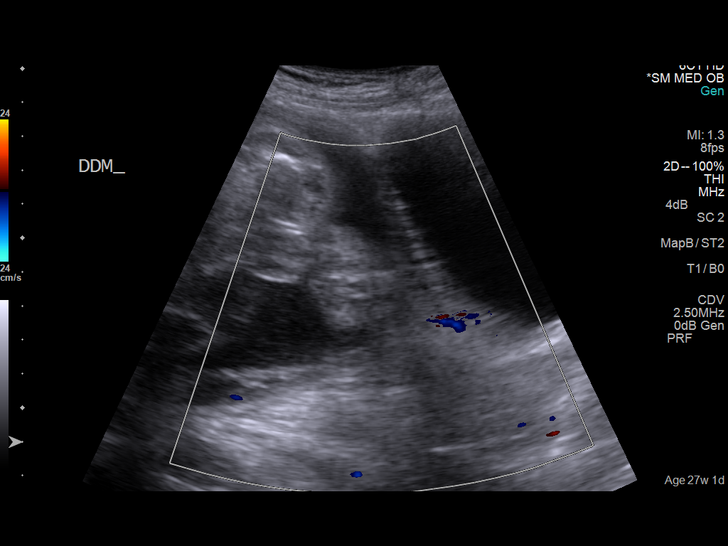
[im 54/59]
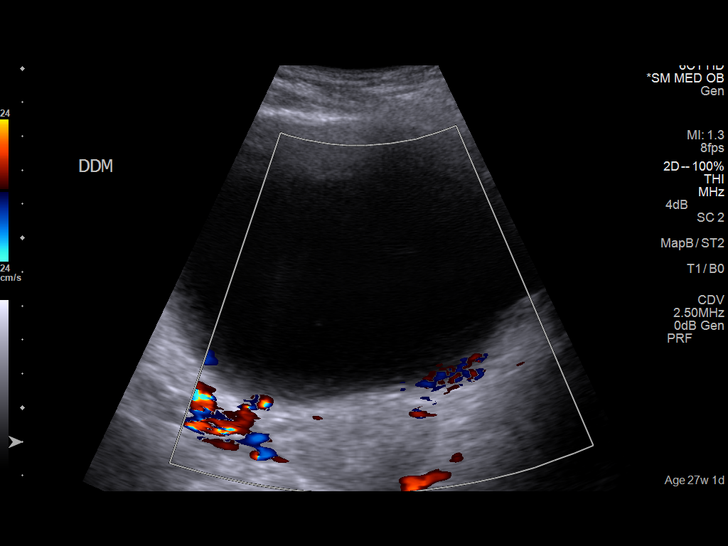
[im 59/59]
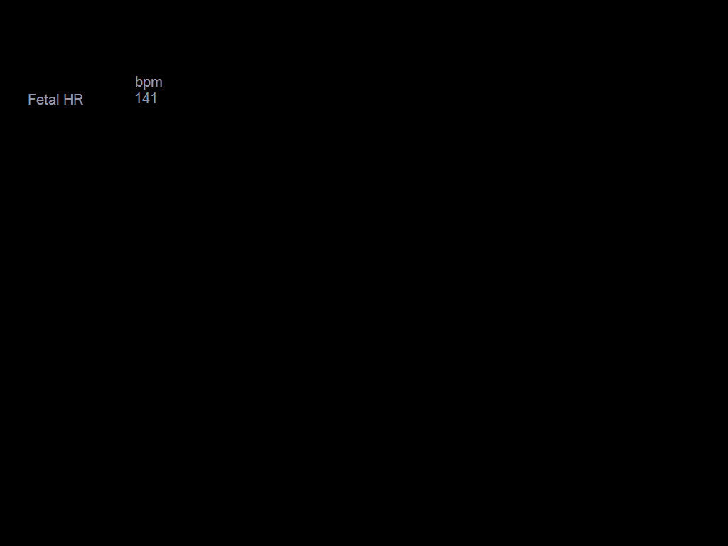

[14 of 28 positions shown; findings below may reference images not displayed]

FINDINGS: Number of Fetuses: 1

Heart Rate:  141 bpm

Movement: Present

Presentation: Breech

Previa: None; the distance from the lower placental segment to the
internal cervical os is 5.9 cm.

Placental Location: Posterior

Amniotic Fluid (Subjective): Normal

AFI 16 cm

FETAL BIOMETRY

BPD:  6.8cm 27w   2d

MATERNAL FINDINGS:

Cervix: Closed. Cervical length of 4.7 cm. Maternal adnexal
structures are unremarkable.
IMPRESSION: Single viable IUP with estimated gestational age of 27 weeks 2 days
which is in agreement with the previous study. There is no placenta
previa. The placenta is posterior, the amniotic fluid volume is
estimated to be normal.

## 2022-11-03 ENCOUNTER — Ambulatory Visit (LOCAL_COMMUNITY_HEALTH_CENTER): Payer: Self-pay

## 2022-11-03 DIAGNOSIS — Z23 Encounter for immunization: Secondary | ICD-10-CM

## 2022-11-03 DIAGNOSIS — Z719 Counseling, unspecified: Secondary | ICD-10-CM

## 2022-11-03 NOTE — Progress Notes (Signed)
VIS given for flu vaccine.   Discussed FluMist vs Fluzone.  Client received Fluzone administered by Regenia Skeeter, Tolerated well. After vaccine care reviewed. Copy of [facility] provided

## 2024-10-03 ENCOUNTER — Ambulatory Visit (INDEPENDENT_AMBULATORY_CARE_PROVIDER_SITE_OTHER): Payer: Self-pay

## 2024-10-03 DIAGNOSIS — L700 Acne vulgaris: Secondary | ICD-10-CM

## 2024-10-03 DIAGNOSIS — L918 Other hypertrophic disorders of the skin: Secondary | ICD-10-CM

## 2024-10-03 MED ORDER — SPIRONOLACTONE 50 MG PO TABS: ORAL_TABLET | ORAL | 3 refills | Status: DC

## 2024-10-03 MED ORDER — TRETINOIN 0.025 % EX CREA: TOPICAL_CREAM | Freq: Every day | CUTANEOUS | 5 refills | Status: AC

## 2024-10-03 NOTE — Progress Notes (Signed)
 Subjective   Yolanda Bruce is a 38 y.o. female who presents for the following: Acne concerns. Patient is new patient.  Today patient reports: Acne concerns at face using dermaologica over the counter products. Patient reports she flares at face only. Patient states she was seen at [facility] years ago and was being treated for hormonal acne and prescribed spironolactone 50 BID and states it was helping.   This patient is accompanied in the office by interpreter.   Review of Systems:    No other skin or systemic complaints except as noted in HPI or Assessment and Plan.  The following portions of the chart were reviewed this encounter and updated as appropriate: medications, allergies, medical history  Relevant Medical History:  n/a   Objective  Well appearing patient in no apparent distress; mood and affect are within normal limits. Examination was performed of the: Focused Exam of: Face, neck   Examination notable for: Acne vulgaris: Scattered open and closed comedones on the face. Red, inflammatory papules and pustules on face   Skin tags neck  Examination limited by: Undergarments, Shoes or socks , Clothing, and Patient deferred removal       Assessment & Plan   SELF PAY - PAID OUT OF POCKET  Acne vulgaris - hormonal and comedonal  - Chronic and persistent condition with duration or expected duration over one year. Condition is symptomatic and bothersome to patient. Patient is flaring and not currently at treatment goal.  - Discussed various treatment options with patient, as well as need for consistent use for at least 6-12 weeks for full efficacy.  - Reviewed treatment options, including side effects of topical agents, oral antibiotics, OCPs (if female), oral spironolactone (if female), and isotretinoin. Discussed that isotretinoin is the most effective  - After discussion opted to initiate: spironolactone   - Start spironolactone 50 mg PO daily (max dose: 100mg  BID, can start  lower if concern for side effects) if tolerating well can increase to taking 100 mg.  - Discussed risk of benefits of this medication as well as common side effects. Explained that the most common side effect is polyuria which is why we recommend she take the medication in the morning. Other side effects include dizziness, breast tenderness/enlargement (which usually resolves after 1 month), and headaches. Also informed the patient that there is a chance this medication could increase her potassium and we will require occasional blood tests.  - Discussed pregnancy category X and patient should discontinue the medication if trying to conceive or if becomes pregnant. - Screened for renal and cardiac dz and personal and family history of breast cancer - No need to screen for underlying hyperkalemia except if >51 years old, h/o renal or cardiac disease, underlying hepatic function, high doses (>200mg /day), or on ACEi's/NSAIDs/Bactrim - After the full discussion patient opted to proceed to treat her acne with spironolactone.  -Start tretinoin 0.025% cream in the evening. Educated patient about proper use and potential side effects, including dryness, irritation, sun sensitivity, and transient worsening of acne.   Acrochordons - Benign, patient reassured of the benign nature of acrochrodons - Discussed cosmetic removal, plan for removal at follow-up .    Level of service outlined above   Procedures, orders, diagnosis for this visit:    There are no diagnoses linked to this encounter.  Return to clinic: Return for 3-4 months for acne, cosmetic skin tag removal w/ Dr. Raymund.  I, Jacquelynn V. Wilfred, CMA, am acting as scribe for Lauraine  JAYSON Kanaris, MD.  Documentation: I have reviewed the above documentation for accuracy and completeness, and I agree with the above.  Lauraine JAYSON Kanaris, MD

## 2024-10-03 NOTE — Patient Instructions (Addendum)
 Plan for Acne  In the morning: - Cleanse face with a gentle cleanser OR benzoyl peroxide wash if instructed to use this at your visit(can be purchased over the counter- examples at the bottom) - Wipe face with clindamycin  wipe if prescribed at your visit. This can be used on entire face/chest/back or as a spot treatment to active acne areas  - Apply an oil-free moisturizer  In the evening: - Cleanse face with a regular gentle face wash - Wait for skin to completely dry - Apply a pea sized amount of your retinoid (tretinoin  or adapalene) to your finger. Dot this around your face, and then rub in - If your skin gets dry, you can follow this up with an oil-free moisturizer  If you were instructed to take minocycline or doxycycline . This is the antibiotic we talked about that you will take twice per day for a 3 month course. Take the medication with food, and don't lie down right after taking it, because it can give you heart burn if you lie down right away.  How to use your Acne creams  Some creams for acne PREVENT acne and some creams TARGET pimples you can see.  Antibiotic creams (erythromycin, benzoyl peroxide, clindamycin  and sulfur sulfacetamide)  How to apply: generally applied once daily either as a spot treatment or all over the face (see above)  Retinoids (differin/adapalene, retin-A /tretinoin  or tazorac) work by PREVENTING acne.  These medicines are good to control acne, but may cause dryness and increase your risk of sunburn.  Do not use if you are PREGNANT or BREAST FEEDING. It takes 4-6 weeks to have results and the acne may worsen in the beginning.  Some retinoids are stronger than others, but are also more irritating. These are prescription topical medications, used to treat acne, sun damage, fine wrinkles, and several other skin changes on the face. Medications in this family include tretinoin /Retin-A , adapalene/Differin, and Tazorac, among others.   A Note on Cosmetic  Use: - Please note, if you are over the age of 75, insurance will typically not cover these medications. If they are recommended to you for non-medically necessary reasons, you may choose to pay for the medication out of pocket. Prices vary, but a tube of generic tretinoin  can often be purchased for ~$60-100 (this size often lasts several months). This varies considerably, and we recommended that you check www.goodrx.com for prescription discount coupons and to compare prices across pharmacies.   How to apply: Use at night time (sunlight makes them inactive). If you wash your face at night, let the skin dry for 20 minutes before applying. Apply to all areas that have breakouts (NOT just to pimples you see). Use a PEA-SIZED amount for the entire face (no more) Dot it on your skin and connect the dots Avoid eyes and lips These may cause irritation in the beginning. To decrease irritation, do the following: 1st month: Use twice a week for the first month  2nd month: Use every other night 3rd month and on: Use every night  Cleansing your skin: -   Do not use harsh "acne" soaps and astringents. - Use water to clean your face.  - If you wear make-up, sunscreen or creams, use non-comedogenic (non-pore clogging) gentle moisturizing wash or cream. Examples include: Cetaphil, Neutrogena, Clinique  Moisturizers and sunscreen: Apply a "non-comedogenic" (non-pore clogging) lotion with sunscreen in the morning. You may need to re-apply during the day. Neutrogena, Eucerin, Clinique, Vanicream    If you have dryness  or irritation, try these tips: Decrease use to every other night or twice weekly as tolerated  Wash the retinoid off after 1 hour and apply a "non-comedogenic" (non-pore-clogging) lotion If dryness or irritation continues, stop using the retinoid.   Benzoyl peroxide washes 3-5% (brand name includes Neutragena Clear Pore, CereVe Acne Foaming Cream Cleanser, Differin Cleanser) that you use  in the shower. Can bleach linens (clothes, towels, etc), will NOT bleach your skin or hair). Dry off with a white towel so it doesn't take the color out of clothing and colored towels.        - Start a face moisturizer when dry --  for people with acne the lotion or cream should be oil-free and non-comedogenic - Examples are: CereVe PM, Cetaphil Face Lotion, Olay Complete, Neutragena Hydro Boost Gel Cream      Due to recent changes in healthcare laws, you may see results of your pathology and/or laboratory studies on MyChart before the doctors have had a chance to review them. We understand that in some cases there may be results that are confusing or concerning to you. Please understand that not all results are received at the same time and often the doctors may need to interpret multiple results in order to provide you with the best plan of care or course of treatment. Therefore, we ask that you please give us  2 business days to thoroughly review all your results before contacting the office for clarification. Should we see a critical lab result, you will be contacted sooner.   If You Need Anything After Your Visit  If you have any questions or concerns for your doctor, please call our main line at [phone] and press option 4 to reach your doctor's medical assistant. If no one answers, please leave a voicemail as directed and we will return your call as soon as possible. Messages left after 4 pm will be answered the following business day.   You may also send us  a message via MyChart. We typically respond to MyChart messages within 1-2 business days.  For prescription refills, please ask your pharmacy to contact our office. Our fax number is [phone].  If you have an urgent issue when the clinic is closed that cannot wait until the next business day, you can page your doctor at the number below.    Please note that while we do our best to be available for urgent issues outside of  office hours, we are not available 24/7.   If you have an urgent issue and are unable to reach us , you may choose to seek medical care at your doctor's office, retail clinic, urgent care center, or emergency room.  If you have a medical emergency, please immediately call 911 or go to the emergency department.  Pager Numbers  - Dr. Hester: [phone]  - Dr. Jackquline: [phone]  - Dr. Claudene: [phone]   In the event of inclement weather, please call our main line at [phone] for an update on the status of any delays or closures.  Dermatology Medication Tips: Please keep the boxes that topical medications come in in order to help keep track of the instructions about where and how to use these. Pharmacies typically print the medication instructions only on the boxes and not directly on the medication tubes.   If your medication is too expensive, please contact our office at [phone] option 4 or send us  a message through MyChart.   We are unable to tell what your co-pay for  medications will be in advance as this is different depending on your insurance coverage. However, we may be able to find a substitute medication at lower cost or fill out paperwork to get insurance to cover a needed medication.   If a prior authorization is required to get your medication covered by your insurance company, please allow us  1-2 business days to complete this process.  Drug prices often vary depending on where the prescription is filled and some pharmacies may offer cheaper prices.  The website www.goodrx.com contains coupons for medications through different pharmacies. The prices here do not account for what the cost may be with help from insurance (it may be cheaper with your insurance), but the website can give you the price if you did not use any insurance.  - You can print the associated coupon and take it with your prescription to the pharmacy.  - You may also stop by our office  during regular business hours and pick up a GoodRx coupon card.  - If you need your prescription sent electronically to a different pharmacy, notify our office through [facility] or by phone at [phone] option 4.     Si Usted Necesita Algo Despus de Su Visita  Tambin puede enviarnos un mensaje a travs de Clinical cytogeneticist. Por lo general respondemos a los mensajes de MyChart en el transcurso de 1 a 2 das hbiles.  Para renovar recetas, por favor pida a su farmacia que se ponga en contacto con nuestra oficina. Randi lakes de fax es [place] [phone].  Si tiene un asunto urgente cuando la clnica est cerrada y que no puede esperar hasta el siguiente da hbil, puede llamar/localizar a su doctor(a) al nmero que aparece a continuacin.   Por favor, tenga en cuenta que aunque hacemos todo lo posible para estar disponibles para asuntos urgentes fuera del horario de [place], no estamos disponibles las 24 horas del da, los 7 [address]  [address] de la [place].   Si tiene un problema urgente y no puede comunicarse con nosotros, puede optar por buscar atencin mdica  en el consultorio de su doctor(a), en una clnica privada, en un centro de atencin urgente o en una sala de emergencias.  Si tiene Engineer, drilling, por favor llame inmediatamente al 911 o vaya a la sala de emergencias.  Nmeros de bper  - Dr. Hester: [phone]  - Dra. Jackquline: [phone]  - Dr. Claudene: [phone]   En caso de inclemencias del tiempo, por favor llame a landry capes principal al [phone] para una actualizacin sobre el [place] de cualquier retraso o cierre.  Consejos para la medicacin en dermatologa: Por favor, guarde las cajas en las que vienen los medicamentos de uso tpico para ayudarle a seguir las instrucciones sobre dnde y cmo usarlos. Las farmacias generalmente imprimen las instrucciones del medicamento slo en las cajas y no directamente en los tubos del [place].   Si su medicamento es  muy caro, por favor, pngase en contacto con landry rieger llamando al [phone] y presione la opcin 4 o envenos un mensaje a travs de Clinical cytogeneticist.   No podemos decirle cul ser su copago por los medicamentos por adelantado ya que esto es diferente dependiendo de la cobertura de su seguro. Sin embargo, es posible que podamos encontrar un medicamento sustituto a Audiological scientist un formulario para que el seguro cubra el medicamento que se considera necesario.   Si se requiere una autorizacin previa para que su compaa de seguros malta su medicamento, por favor  permtanos de 1 a 2 das hbiles para completar [address]  Los precios de los medicamentos varan con frecuencia dependiendo del Environmental consultant de dnde se surte la receta y alguna farmacias pueden ofrecer precios ms baratos.  El sitio web www.goodrx.com tiene cupones para medicamentos de Health and safety inspector. Los precios aqu no tienen en cuenta lo que podra costar con la ayuda del seguro (puede ser ms barato con su seguro), pero el sitio web puede darle el precio si no utiliz Tourist information centre manager.  - Puede imprimir el cupn correspondiente y llevarlo con su receta a la farmacia.  - Tambin puede pasar por nuestra oficina durante el horario de atencin regular y Education officer, museum una tarjeta de cupones de GoodRx.  - Si necesita que su receta se enve electrnicamente a una farmacia diferente, informe a nuestra oficina a travs de MyChart de  o por telfono llamando al [phone] y presione la opcin 4.

## 2024-12-28 ENCOUNTER — Other Ambulatory Visit: Payer: Self-pay

## 2024-12-28 MED ORDER — SPIRONOLACTONE 50 MG PO TABS: ORAL_TABLET | ORAL | 0 refills | Status: AC

## 2024-12-28 NOTE — Progress Notes (Signed)
 Patient states she can not get refill from [facility]. aw

## 2025-01-09 ENCOUNTER — Ambulatory Visit: Payer: Self-pay

## 2025-01-18 ENCOUNTER — Ambulatory Visit: Payer: Self-pay
